# Patient Record
Sex: Male | Born: 1960 | Race: Black or African American | Hispanic: No | Marital: Married | Smoking: Light tobacco smoker
Health system: Southern US, Community
[De-identification: ages and names within clinical notes are randomized; demographics above are authoritative.]

## PROBLEM LIST (undated history)

## (undated) DIAGNOSIS — I10 Essential (primary) hypertension: Secondary | ICD-10-CM

## (undated) HISTORY — PX: KNEE SURGERY: SHX244

---

## 2002-07-17 ENCOUNTER — Ambulatory Visit (HOSPITAL_COMMUNITY): Admission: RE | Admit: 2002-07-17 | Discharge: 2002-07-17 | Payer: Self-pay | Admitting: *Deleted

## 2002-09-11 ENCOUNTER — Emergency Department (HOSPITAL_COMMUNITY): Admission: EM | Admit: 2002-09-11 | Discharge: 2002-09-11 | Payer: Self-pay | Admitting: Emergency Medicine

## 2005-07-18 ENCOUNTER — Emergency Department (HOSPITAL_COMMUNITY): Admission: EM | Admit: 2005-07-18 | Discharge: 2005-07-18 | Payer: Self-pay | Admitting: Family Medicine

## 2005-07-23 ENCOUNTER — Emergency Department (HOSPITAL_COMMUNITY): Admission: EM | Admit: 2005-07-23 | Discharge: 2005-07-23 | Payer: Self-pay | Admitting: Family Medicine

## 2005-08-01 ENCOUNTER — Encounter: Admission: RE | Admit: 2005-08-01 | Discharge: 2005-10-30 | Payer: Self-pay | Admitting: Occupational Medicine

## 2007-07-31 ENCOUNTER — Emergency Department (HOSPITAL_COMMUNITY): Admission: EM | Admit: 2007-07-31 | Discharge: 2007-07-31 | Payer: Self-pay | Admitting: Family Medicine

## 2008-04-06 ENCOUNTER — Emergency Department (HOSPITAL_COMMUNITY): Admission: EM | Admit: 2008-04-06 | Discharge: 2008-04-07 | Payer: Self-pay | Admitting: Emergency Medicine

## 2008-04-07 ENCOUNTER — Ambulatory Visit: Payer: Self-pay | Admitting: Sports Medicine

## 2008-04-07 DIAGNOSIS — M66259 Spontaneous rupture of extensor tendons, unspecified thigh: Secondary | ICD-10-CM

## 2008-04-09 ENCOUNTER — Ambulatory Visit (HOSPITAL_COMMUNITY): Admission: RE | Admit: 2008-04-09 | Discharge: 2008-04-09 | Payer: Self-pay | Admitting: Orthopedic Surgery

## 2010-11-01 NOTE — Op Note (Signed)
Nathan Padilla, Nathan Padilla             ACCOUNT NO.:  [id number]   MEDICAL RECORD NO.:  [id number]          PATIENT TYPE:  AMB   LOCATION:  SDS                          FACILITY:  [facility]   PHYSICIAN:  Nadara Mustard, MD     DATE OF BIRTH:  [birth year]   DATE OF PROCEDURE:  04/09/2008  DATE OF DISCHARGE:                               OPERATIVE REPORT   PREOPERATIVE DIAGNOSIS:  Complete left quadriceps tendon rupture.   POSTOPERATIVE DIAGNOSIS:  Complete left quadriceps tendon rupture.   PROCEDURE:  Reconstruction of left quadriceps tendon.   SURGEON:  Nadara Mustard, MD   ANESTHESIA:  General plus femoral block.   ESTIMATED BLOOD LOSS:  Minimal.   ANTIBIOTICS:  1 gram of Kefzol.   DRAINS:  None.   COMPLICATIONS:  None.   TOURNIQUET TIME:  None.   DISPOSITION:  To PACU in stable condition.   INDICATIONS FOR PROCEDURE:  The patient is a 50 year old gentleman who  was playing baseball when he quickly tried to change direction and  sustained a complete quad tendon rupture.  On examination, he had  complete inability to do a straight leg raise, complete disruption of  the quad tendon.  An ultrasound confirmed the quad tendon rupture.  The  patient is brought at this time for reconstruction of the quad tendon.  Risks and benefits were discussed including infection, neurovascular  injury, rerupture, and need for additional surgery.  The patient states  he understands and wishes to proceed at this time.   DESCRIPTION OF PROCEDURE:  The patient was brought to OR room #17 after  undergoing a popliteal block and he then underwent a general anesthetic.  After adequate level of anesthesia obtained, the patient's left lower  extremity was prepped using chlorhexidine and draped into a sterile  field.  A midline dorsal incision was made over the quad tendon and the  patella.  Visualization showed a complete rupture of the quad tendon.  There was a large hematoma, which was evacuated.  The  wound was  irrigated with 1 liter of normal saline.  Using crack-out suture  technique, 2 crack-out sutures were weaved through the proximal aspect  of the quad tendon exiting distally.  Then 3 separate holes were drilled  through the patella.  These sutures were passed through the patella and  tied over the distal pole of the patella with 4 strands crossing the  repair site.  Another #2 Ethilon was then used to weave across the  repair to reinforce the repair.  The patient's knee was placed through a  range of motion and there was stable fixation.  Again, the wound was  irrigated with normal saline.  Subcu was closed using Vicryl.  The skin  was closed using approximating staples.  The wound was covered with  Adaptic orthopedic sponges, Webril, and Coban.  The patient was placed  in knee immobilizer, extubated and taken to PACU in stable condition.  He was discharged to home.   He has prescription for Vicodin for pain.  Follow up in the office in 1  week.  He is given  a note to return to work on Tuesday, April 14, 2008.      Nadara Mustard, MD  Electronically Signed     MVD/MEDQ  D:  04/09/2008  T:  04/10/2008  Job:  [id number]

## 2011-03-21 LAB — PROTIME-INR
INR: 0.9
Prothrombin Time: 12.1

## 2011-03-21 LAB — CBC
HCT: 41.4
Hemoglobin: 13.3
MCHC: 32.1
MCV: 72.1 — ABNORMAL LOW
Platelets: 225
RBC: 5.74
RDW: 15.2
WBC: 4.9

## 2011-03-21 LAB — COMPREHENSIVE METABOLIC PANEL
ALT: 18
AST: 33
Albumin: 3.8
Alkaline Phosphatase: 40
BUN: 9
CO2: 28
Calcium: 9.5
Chloride: 105
Creatinine, Ser: 0.88
GFR calc Af Amer: >60
GFR calc non Af Amer: >60
Glucose, Bld: 86
Potassium: 4.5
Sodium: 139
Total Bilirubin: 1.4 — ABNORMAL HIGH
Total Protein: 6.5

## 2011-03-21 LAB — APTT: aPTT: 28

## 2015-06-22 DIAGNOSIS — I10 Essential (primary) hypertension: Secondary | ICD-10-CM | POA: Diagnosis not present

## 2015-06-22 DIAGNOSIS — E785 Hyperlipidemia, unspecified: Secondary | ICD-10-CM | POA: Diagnosis not present

## 2015-06-22 DIAGNOSIS — Z1211 Encounter for screening for malignant neoplasm of colon: Secondary | ICD-10-CM | POA: Diagnosis not present

## 2015-07-15 DIAGNOSIS — J342 Deviated nasal septum: Secondary | ICD-10-CM | POA: Diagnosis not present

## 2015-07-15 DIAGNOSIS — J330 Polyp of nasal cavity: Secondary | ICD-10-CM | POA: Diagnosis not present

## 2015-07-27 MED FILL — ATORVASTATIN 10 MG TABLET: 10 | 90 days supply | Qty: 90 | Fill #2

## 2015-07-27 MED FILL — GAVILYTE-N SOLUTION: 420 | 1 days supply | Qty: 4000 | Fill #0

## 2015-07-27 MED FILL — LOSARTAN-HCTZ 50-12.5 MG TA: 50-12.5 | 90 days supply | Qty: 90 | Fill #2

## 2015-07-28 DIAGNOSIS — D126 Benign neoplasm of colon, unspecified: Secondary | ICD-10-CM | POA: Diagnosis not present

## 2015-07-28 DIAGNOSIS — D123 Benign neoplasm of transverse colon: Secondary | ICD-10-CM | POA: Diagnosis not present

## 2015-07-28 DIAGNOSIS — K621 Rectal polyp: Secondary | ICD-10-CM | POA: Diagnosis not present

## 2015-07-28 DIAGNOSIS — D128 Benign neoplasm of rectum: Secondary | ICD-10-CM | POA: Diagnosis not present

## 2015-07-28 DIAGNOSIS — K5730 Diverticulosis of large intestine without perforation or abscess without bleeding: Secondary | ICD-10-CM | POA: Diagnosis not present

## 2015-07-28 DIAGNOSIS — Z1211 Encounter for screening for malignant neoplasm of colon: Secondary | ICD-10-CM | POA: Diagnosis not present

## 2015-12-17 MED FILL — ATORVASTATIN 10 MG TABLET: 10 | 30 days supply | Qty: 30 | Fill #1

## 2015-12-17 MED FILL — LOSARTAN-HCTZ 50-12.5 MG TA: 50-12.5 | 90 days supply | Qty: 90 | Fill #0

## 2016-01-10 ENCOUNTER — Inpatient Hospital Stay (HOSPITAL_COMMUNITY)
Admission: EM | Admit: 2016-01-10 | Discharge: 2016-01-12 | DRG: 378 | Disposition: A | Discharge status: Home / Self Care | Payer: 59 | Attending: Internal Medicine | Admitting: Internal Medicine

## 2016-01-10 ENCOUNTER — Encounter (HOSPITAL_COMMUNITY): Payer: Self-pay | Admitting: *Deleted

## 2016-01-10 DIAGNOSIS — R000 Tachycardia, unspecified: Secondary | ICD-10-CM

## 2016-01-10 DIAGNOSIS — Z79899 Other long term (current) drug therapy: Secondary | ICD-10-CM | POA: Diagnosis not present

## 2016-01-10 DIAGNOSIS — R0600 Dyspnea, unspecified: Secondary | ICD-10-CM | POA: Diagnosis not present

## 2016-01-10 DIAGNOSIS — E871 Hypo-osmolality and hyponatremia: Secondary | ICD-10-CM | POA: Diagnosis not present

## 2016-01-10 DIAGNOSIS — I959 Hypotension, unspecified: Secondary | ICD-10-CM | POA: Diagnosis not present

## 2016-01-10 DIAGNOSIS — D649 Anemia, unspecified: Secondary | ICD-10-CM | POA: Diagnosis not present

## 2016-01-10 DIAGNOSIS — Z91013 Allergy to seafood: Secondary | ICD-10-CM

## 2016-01-10 DIAGNOSIS — E876 Hypokalemia: Secondary | ICD-10-CM | POA: Diagnosis present

## 2016-01-10 DIAGNOSIS — K648 Other hemorrhoids: Secondary | ICD-10-CM | POA: Diagnosis present

## 2016-01-10 DIAGNOSIS — K635 Polyp of colon: Secondary | ICD-10-CM | POA: Diagnosis present

## 2016-01-10 DIAGNOSIS — R739 Hyperglycemia, unspecified: Secondary | ICD-10-CM | POA: Diagnosis present

## 2016-01-10 DIAGNOSIS — K922 Gastrointestinal hemorrhage, unspecified: Secondary | ICD-10-CM | POA: Diagnosis present

## 2016-01-10 DIAGNOSIS — I10 Essential (primary) hypertension: Secondary | ICD-10-CM | POA: Diagnosis not present

## 2016-01-10 DIAGNOSIS — Z7982 Long term (current) use of aspirin: Secondary | ICD-10-CM | POA: Diagnosis not present

## 2016-01-10 DIAGNOSIS — K269 Duodenal ulcer, unspecified as acute or chronic, without hemorrhage or perforation: Secondary | ICD-10-CM | POA: Diagnosis not present

## 2016-01-10 DIAGNOSIS — K921 Melena: Principal | ICD-10-CM | POA: Diagnosis present

## 2016-01-10 DIAGNOSIS — Z87891 Personal history of nicotine dependence: Secondary | ICD-10-CM

## 2016-01-10 DIAGNOSIS — D62 Acute posthemorrhagic anemia: Secondary | ICD-10-CM | POA: Diagnosis not present

## 2016-01-10 DIAGNOSIS — R0602 Shortness of breath: Secondary | ICD-10-CM | POA: Diagnosis not present

## 2016-01-10 HISTORY — DX: Essential (primary) hypertension: I10

## 2016-01-10 LAB — CBC WITH DIFFERENTIAL/PLATELET
BASOS ABS: 0 10*3/uL (ref 0.0–0.1)
BASOS PCT: 0 %
EOS ABS: 0 10*3/uL (ref 0.0–0.7)
Eosinophils Relative: 0 %
HCT: 16.3 % — ABNORMAL LOW (ref 39.0–52.0)
Hemoglobin: 5 g/dL — CL (ref 13.0–17.0)
Lymphocytes Relative: 22 %
Lymphs Abs: 1.7 10*3/uL (ref 0.7–4.0)
MCH: 22.5 pg — AB (ref 26.0–34.0)
MCHC: 30.7 g/dL (ref 30.0–36.0)
MCV: 73.4 fL — ABNORMAL LOW (ref 78.0–100.0)
MONO ABS: 0.5 10*3/uL (ref 0.1–1.0)
Monocytes Relative: 6 %
NEUTROS PCT: 72 %
Neutro Abs: 5.4 10*3/uL (ref 1.7–7.7)
PLATELETS: 215 10*3/uL (ref 150–400)
RBC: 2.22 MIL/uL — AB (ref 4.22–5.81)
RDW: 17.7 % — ABNORMAL HIGH (ref 11.5–15.5)
WBC: 7.6 10*3/uL (ref 4.0–10.5)

## 2016-01-10 LAB — I-STAT CHEM 8, ED
BUN: 11 mg/dL (ref 6–20)
CREATININE: 1 mg/dL (ref 0.61–1.24)
Calcium, Ion: 1.12 mmol/L — ABNORMAL LOW (ref 1.13–1.30)
Chloride: 95 mmol/L — ABNORMAL LOW (ref 101–111)
Glucose, Bld: 172 mg/dL — ABNORMAL HIGH (ref 65–99)
HEMATOCRIT: 17 % — AB (ref 39.0–52.0)
HEMOGLOBIN: 5.8 g/dL — AB (ref 13.0–17.0)
POTASSIUM: 3.4 mmol/L — AB (ref 3.5–5.1)
Sodium: 134 mmol/L — ABNORMAL LOW (ref 135–145)
TCO2: 24 mmol/L (ref 0–100)

## 2016-01-10 LAB — POC OCCULT BLOOD, ED: Fecal Occult Bld: POSITIVE — AB

## 2016-01-10 LAB — COMPREHENSIVE METABOLIC PANEL
ALBUMIN: 3.4 g/dL — AB (ref 3.5–5.0)
ALT: 19 U/L (ref 17–63)
AST: 22 U/L (ref 15–41)
Alkaline Phosphatase: 26 U/L — ABNORMAL LOW (ref 38–126)
Anion gap: 9 (ref 5–15)
BUN: 10 mg/dL (ref 6–20)
CHLORIDE: 99 mmol/L — AB (ref 101–111)
CO2: 25 mmol/L (ref 22–32)
CREATININE: 1.15 mg/dL (ref 0.61–1.24)
Calcium: 8.5 mg/dL — ABNORMAL LOW (ref 8.9–10.3)
GFR calc non Af Amer: >60 mL/min (ref >60)
Glucose, Bld: 182 mg/dL — ABNORMAL HIGH (ref 65–99)
Potassium: 3.3 mmol/L — ABNORMAL LOW (ref 3.5–5.1)
SODIUM: 133 mmol/L — AB (ref 135–145)
Total Bilirubin: 0.4 mg/dL (ref 0.3–1.2)
Total Protein: 5.9 g/dL — ABNORMAL LOW (ref 6.5–8.1)

## 2016-01-10 NOTE — ED Notes (Signed)
PA-C at bedside 

## 2016-01-10 NOTE — ED Triage Notes (Signed)
Pt was seen at PCP around 1700, had blood drawn, results showed hgb of 5. Pt reports weakness, shortness of breath with exertion, denies CP, dark stools since last Tuesday, wife is a nurse on [employer] [place], cap refill slow, tongue is white and dry . Pt take lose dose asa, no thinners.

## 2016-01-10 NOTE — ED Provider Notes (Signed)
[facility] DEPT Provider Note   CSN: [id number] Arrival date & time: 01/10/16  2020  First Provider Contact:  First MD Initiated Contact with Patient 01/10/16 2228        History   Chief Complaint Chief Complaint  Patient presents with  . GI Bleeding    HPI Nathan Padilla is a 55 y.o. male.  The history is provided by the patient, the spouse and medical records. No language interpreter was used.     Nathan Padilla is a 55 y.o. male  with a hx of HTN presents to the Emergency Department complaining of gradual, persistent, progressively worsening SOB onset Friday night while cutting the grass. SOB has persisted with exertion throughout the weekend.  He reports he was seen by his PCP today with a very low Hgb.  Associated symptoms include dark black coffee ground stool onset 6 days ago.  No vomiting, fever, chills, Abdominal pain, vomiting or diarrhea.  Pt reports no international travel, no tick bites, no camping or hiking in the woods.   Nothing makes it better and nothing makes it worse.    Pt takes ASA daily, but no anticoagulants.  Patient's wife is at bedside. She reports patient had a colonoscopy by Dr. Michail Sermon approximately 7 months ago. There were several polyps removed at that time but no other complications.   Past Medical History:  Diagnosis Date  . Hypertension     Patient Active Problem List   Diagnosis Date Noted  . RUPTURE, QUADRICEPS TENDON 04/07/2008    Past Surgical History:  Procedure Laterality Date  . KNEE SURGERY         Home Medications    Prior to Admission medications   Medication Sig Start Date End Date Taking? Authorizing Provider  aspirin EC 81 MG tablet Take 81 mg by mouth daily.   Yes Historical Provider, MD  atorvastatin (LIPITOR) 10 MG tablet Take 10 mg by mouth daily. 12/17/15  Yes Historical Provider, MD  ibuprofen (ADVIL,MOTRIN) 200 MG tablet Take 400 mg by mouth daily as needed (pain).   Yes Historical Provider, MD    losartan-hydrochlorothiazide (HYZAAR) 50-12.5 MG tablet Take 1 tablet by mouth daily. 12/17/15  Yes Historical Provider, MD    Family History History reviewed. No pertinent family history.  Social History Social History  Substance Use Topics  . Smoking status: Light Tobacco Smoker    Types: Cigars  . Smokeless tobacco: Never Used  . Alcohol use Yes     Allergies   Shellfish allergy   Review of Systems Review of Systems  Constitutional: Positive for fatigue.  Respiratory: Positive for shortness of breath.   Cardiovascular: Negative for chest pain, palpitations and leg swelling.  Gastrointestinal: Positive for blood in stool ( black and tarry). Negative for abdominal pain, diarrhea, nausea and vomiting.  Skin: Negative for rash.  Neurological: Positive for weakness ( generalized).  All other systems reviewed and are negative.    Physical Exam Updated Vital Signs BP 113/76 (BP Location: Right Arm)   Pulse 119   Temp 98.3 F (36.8 C) (Oral)   Resp 16   Ht 6\' 3"  (1.905 m)   Wt 99.8 kg   SpO2 100%   BMI 27.50 kg/m   Physical Exam  Constitutional: He appears well-developed and well-nourished. No distress.  Awake, alert, nontoxic appearance  HENT:  Head: Normocephalic and atraumatic.  Mouth/Throat: Oropharynx is clear and moist. No oropharyngeal exudate.  Mucous membranes are pale  Eyes: Conjunctivae are normal. No scleral  icterus.  Neck: Normal range of motion. Neck supple.  Cardiovascular: Regular rhythm and intact distal pulses.  Tachycardia present.   No murmur heard. Pulmonary/Chest: Effort normal and breath sounds normal. No respiratory distress. He has no wheezes.  Equal chest expansion  Abdominal: Soft. Bowel sounds are normal. He exhibits no mass. There is tenderness ( Generalize, described as soreness). There is no rebound and no guarding.  Musculoskeletal: Normal range of motion. He exhibits no edema.  Neurological: He is alert.  Speech is clear and  goal oriented Moves extremities without ataxia  Skin: Skin is warm and dry. He is not diaphoretic. There is pallor.  Psychiatric: He has a normal mood and affect.  Nursing note and vitals reviewed.    ED Treatments / Results  Labs (all labs ordered are listed, but only abnormal results are displayed) Labs Reviewed  CBC WITH DIFFERENTIAL/PLATELET - Abnormal; Notable for the following:       Result Value   RBC 2.22 (*)    Hemoglobin 5.0 (*)    HCT 16.3 (*)    MCV 73.4 (*)    MCH 22.5 (*)    RDW 17.7 (*)    All other components within normal limits  COMPREHENSIVE METABOLIC PANEL - Abnormal; Notable for the following:    Sodium 133 (*)    Potassium 3.3 (*)    Chloride 99 (*)    Glucose, Bld 182 (*)    Calcium 8.5 (*)    Total Protein 5.9 (*)    Albumin 3.4 (*)    Alkaline Phosphatase 26 (*)    All other components within normal limits  I-STAT CHEM 8, ED - Abnormal; Notable for the following:    Sodium 134 (*)    Potassium 3.4 (*)    Chloride 95 (*)    Glucose, Bld 172 (*)    Calcium, Ion 1.12 (*)    Hemoglobin 5.8 (*)    HCT 17.0 (*)    All other components within normal limits  POC OCCULT BLOOD, ED - Abnormal; Notable for the following:    Fecal Occult Bld POSITIVE (*)    All other components within normal limits  PROTIME-INR  APTT  TYPE AND SCREEN  ABO/RH  PREPARE RBC (CROSSMATCH)    EKG  EKG Interpretation None       Radiology No results found.  Procedures Procedures (including critical care time)  Medications Ordered in ED Medications  0.9 %  sodium chloride infusion (not administered)     Initial Impression / Assessment and Plan / ED Course  I have reviewed the triage vital signs and the nursing notes.  Pertinent labs & imaging results that were available during my care of the patient were reviewed by me and considered in my medical decision making (see chart for details).  Clinical Course  Value Comment By Time  Fecal Occult Blood, POC:  (!) POSITIVE With black tarry stool Dierdre Forth, PA-C 07/25 0021  Potassium: (!) 3.4 Mild  Latavion Halls, PA-C 07/25 0021  Hemoglobin: (!!) 5.0 With symptomatic anemia Dierdre Forth, PA-C 07/25 0021  Platelets: 215 normal Dierdre Forth, PA-C 07/25 0022    Nathan Padilla presents with shortness of breath, tachycardia and black tarry stools. Hemoglobin of 5. Patient with symptomatic anemia. Discussed risk versus benefit of blood transfusion and patient wishes to proceed. He will need admission.  Black tarry stools consistent with upper GI bleed. Fecal occult is positive.    12:43 AM  Discussed with Dr. Julian Reil who  will admit to inpatient stepdown.    Final Clinical Impressions(s) / ED Diagnoses   Final diagnoses:  Symptomatic anemia  Hypokalemia  Upper GI bleed  SOB (shortness of breath)  Tachycardia    New Prescriptions New Prescriptions   No medications on file     Abigail Butts, PA-C 01/11/16 0043    Ripley Fraise, MD 01/11/16 2324

## 2016-01-11 ENCOUNTER — Encounter (HOSPITAL_COMMUNITY)
Admission: EM | Disposition: A | Discharge status: Home / Self Care | Payer: Self-pay | Source: Home / Self Care | Attending: Internal Medicine

## 2016-01-11 ENCOUNTER — Inpatient Hospital Stay (HOSPITAL_COMMUNITY): Payer: 59

## 2016-01-11 ENCOUNTER — Encounter (HOSPITAL_COMMUNITY): Payer: Self-pay | Admitting: *Deleted

## 2016-01-11 DIAGNOSIS — I10 Essential (primary) hypertension: Secondary | ICD-10-CM | POA: Diagnosis present

## 2016-01-11 DIAGNOSIS — Z7982 Long term (current) use of aspirin: Secondary | ICD-10-CM | POA: Diagnosis not present

## 2016-01-11 DIAGNOSIS — Z79899 Other long term (current) drug therapy: Secondary | ICD-10-CM | POA: Diagnosis not present

## 2016-01-11 DIAGNOSIS — K922 Gastrointestinal hemorrhage, unspecified: Secondary | ICD-10-CM | POA: Diagnosis present

## 2016-01-11 DIAGNOSIS — R739 Hyperglycemia, unspecified: Secondary | ICD-10-CM | POA: Diagnosis present

## 2016-01-11 DIAGNOSIS — Z87891 Personal history of nicotine dependence: Secondary | ICD-10-CM | POA: Diagnosis not present

## 2016-01-11 DIAGNOSIS — Z91013 Allergy to seafood: Secondary | ICD-10-CM | POA: Diagnosis not present

## 2016-01-11 DIAGNOSIS — K635 Polyp of colon: Secondary | ICD-10-CM | POA: Diagnosis present

## 2016-01-11 DIAGNOSIS — D649 Anemia, unspecified: Secondary | ICD-10-CM | POA: Diagnosis not present

## 2016-01-11 DIAGNOSIS — D62 Acute posthemorrhagic anemia: Secondary | ICD-10-CM | POA: Diagnosis present

## 2016-01-11 DIAGNOSIS — E876 Hypokalemia: Secondary | ICD-10-CM | POA: Diagnosis not present

## 2016-01-11 DIAGNOSIS — R0602 Shortness of breath: Secondary | ICD-10-CM | POA: Diagnosis not present

## 2016-01-11 DIAGNOSIS — K921 Melena: Secondary | ICD-10-CM | POA: Diagnosis not present

## 2016-01-11 DIAGNOSIS — K648 Other hemorrhoids: Secondary | ICD-10-CM | POA: Diagnosis present

## 2016-01-11 DIAGNOSIS — E871 Hypo-osmolality and hyponatremia: Secondary | ICD-10-CM | POA: Diagnosis present

## 2016-01-11 DIAGNOSIS — K269 Duodenal ulcer, unspecified as acute or chronic, without hemorrhage or perforation: Secondary | ICD-10-CM | POA: Diagnosis present

## 2016-01-11 DIAGNOSIS — K264 Chronic or unspecified duodenal ulcer with hemorrhage: Secondary | ICD-10-CM | POA: Diagnosis not present

## 2016-01-11 HISTORY — PX: ESOPHAGOGASTRODUODENOSCOPY: SHX5428

## 2016-01-11 LAB — MRSA PCR SCREENING: MRSA BY PCR: NEGATIVE

## 2016-01-11 LAB — CBC
HCT: 26.8 % — ABNORMAL LOW (ref 39.0–52.0)
HEMOGLOBIN: 8.3 g/dL — AB (ref 13.0–17.0)
MCH: 24.6 pg — AB (ref 26.0–34.0)
MCHC: 31 g/dL (ref 30.0–36.0)
MCV: 79.5 fL (ref 78.0–100.0)
Platelets: 182 10*3/uL (ref 150–400)
RBC: 3.37 MIL/uL — ABNORMAL LOW (ref 4.22–5.81)
RDW: 20.3 % — AB (ref 11.5–15.5)
WBC: 6.5 10*3/uL (ref 4.0–10.5)

## 2016-01-11 LAB — ABO/RH: ABO/RH(D): B POS

## 2016-01-11 LAB — PROTIME-INR
INR: 1.01 (ref 0.00–1.49)
Prothrombin Time: 13.5 seconds (ref 11.6–15.2)

## 2016-01-11 LAB — PREPARE RBC (CROSSMATCH)

## 2016-01-11 LAB — APTT: aPTT: 26 seconds (ref 24–37)

## 2016-01-11 SURGERY — EGD (ESOPHAGOGASTRODUODENOSCOPY)
Anesthesia: Moderate Sedation

## 2016-01-11 MED ORDER — POTASSIUM CHLORIDE CRYS ER 20 MEQ PO TBCR
40.0000 meq | EXTENDED_RELEASE_TABLET | Freq: Once | ORAL | Status: AC
  Administered 2016-01-11: 40 meq via ORAL

## 2016-01-11 MED ORDER — SODIUM CHLORIDE 0.9 % IV SOLN: INTRAVENOUS | Status: DC

## 2016-01-11 MED ORDER — SODIUM CHLORIDE 0.9% FLUSH
3.0000 mL | Freq: Two times a day (BID) | INTRAVENOUS | Status: DC
  Administered 2016-01-11 – 2016-01-12 (×3): 3 mL via INTRAVENOUS

## 2016-01-11 MED ORDER — MIDAZOLAM HCL 10 MG/2ML IJ SOLN
INTRAMUSCULAR | Status: DC | PRN
  Administered 2016-01-11 (×2): 1 mg via INTRAVENOUS
  Administered 2016-01-11 (×2): 2 mg via INTRAVENOUS

## 2016-01-11 MED ORDER — SODIUM CHLORIDE 0.9 % IV SOLN: Freq: Once | INTRAVENOUS | Status: DC

## 2016-01-11 MED ORDER — DIPHENHYDRAMINE HCL 50 MG/ML IJ SOLN
INTRAMUSCULAR | Status: DC | PRN
  Administered 2016-01-11: 25 mg via INTRAVENOUS

## 2016-01-11 MED ORDER — FENTANYL CITRATE (PF) 100 MCG/2ML IJ SOLN
INTRAMUSCULAR | Status: AC
  Filled 2016-01-11: qty 2

## 2016-01-11 MED ORDER — ATORVASTATIN CALCIUM 10 MG PO TABS
10.0000 mg | ORAL_TABLET | Freq: Every day | ORAL | Status: DC
  Administered 2016-01-11 – 2016-01-12 (×2): 10 mg via ORAL
  Filled 2016-01-11 (×2): qty 1

## 2016-01-11 MED ORDER — FENTANYL CITRATE (PF) 100 MCG/2ML IJ SOLN
INTRAMUSCULAR | Status: DC | PRN
  Administered 2016-01-11 (×3): 25 ug via INTRAVENOUS

## 2016-01-11 MED ORDER — MIDAZOLAM HCL 5 MG/ML IJ SOLN
INTRAMUSCULAR | Status: AC
  Filled 2016-01-11: qty 2

## 2016-01-11 MED ORDER — DIPHENHYDRAMINE HCL 50 MG/ML IJ SOLN
INTRAMUSCULAR | Status: AC
  Filled 2016-01-11: qty 1

## 2016-01-11 MED ORDER — PANTOPRAZOLE SODIUM 40 MG PO TBEC
80.0000 mg | DELAYED_RELEASE_TABLET | Freq: Two times a day (BID) | ORAL | Status: DC
  Administered 2016-01-11 – 2016-01-12 (×4): 80 mg via ORAL
  Filled 2016-01-11 (×4): qty 2

## 2016-01-11 MED ORDER — KCL IN DEXTROSE-NACL 20-5-0.9 MEQ/L-%-% IV SOLN
INTRAVENOUS | Status: DC
  Administered 2016-01-11: 12:00:00 via INTRAVENOUS
  Filled 2016-01-11 (×2): qty 1000

## 2016-01-11 MED ORDER — POTASSIUM CHLORIDE 10 MEQ/100ML IV SOLN
10.0000 meq | INTRAVENOUS | Status: DC
  Administered 2016-01-11: 10 meq via INTRAVENOUS
  Filled 2016-01-11 (×3): qty 100

## 2016-01-11 MED ORDER — BUTAMBEN-TETRACAINE-BENZOCAINE 2-2-14 % EX AERO
INHALATION_SPRAY | CUTANEOUS | Status: DC | PRN
  Administered 2016-01-11: 2 via TOPICAL

## 2016-01-11 NOTE — Progress Notes (Signed)
[facility] TEAM 1 - Stepdown/ICU TEAM  YORK HARCUM  [id number] DOB: 03/30/1961 DOA: 01/10/2016 PCP: Kandice Hams, MD    Brief Narrative:  55 y.o. male with history of HTN who presented to the ED with c/o SOB.  Symptoms onset was ~4 days prior when he was cutting the grass.  Symptoms gradually worsening since onset so he saw his PCP who found he had a HGB of 5 and sent him in to ED.  Patient reported black tarry stool which began approx 6 days prior to his presentation;  initially liquid but then become formed over the course of the week.  Subjective: Pt is seen for a f/u visit.    Assessment & Plan:  Melena - ?UGIB  Severe acute blood loss anemia  Hyponatremia  Hypokalemia  Hyperglycemia without history of diabetes  HTN  DVT prophylaxis: SCDs Code Status: FULL CODE Family Communication: no family present at time of exam  Disposition Plan:   Consultants:  [facility]  Procedures: none  Antimicrobials:  none   Objective: Blood pressure 126/82, pulse 93, temperature 99 F (37.2 C), temperature source Oral, resp. rate 14, height 6\' 4"  (1.93 m), weight 93.2 kg (205 lb 7.5 oz), SpO2 99 %.  Intake/Output Summary (Last 24 hours) at 01/11/16 0922 Last data filed at 01/11/16 0838  Gross per 24 hour  Intake             2327 ml  Output                0 ml  Net             2327 ml   Filed Weights   01/10/16 2031 01/11/16 0210  Weight: 99.8 kg (220 lb) 93.2 kg (205 lb 7.5 oz)    Examination: Pt was seen for a f/u visit.    CBC:  Recent Labs Lab 01/10/16 2100 01/10/16 2101  WBC  --  7.6  NEUTROABS  --  5.4  HGB 5.8* 5.0*  HCT 17.0* 16.3*  MCV  --  73.4*  PLT  --  123456   Basic Metabolic Panel:  Recent Labs Lab 01/10/16 2100 01/10/16 2101  NA 134* 133*  K 3.4* 3.3*  CL 95* 99*  CO2  --  25  GLUCOSE 172* 182*  BUN 11 10  CREATININE 1.00 1.15  CALCIUM  --  8.5*   GFR: Estimated Creatinine Clearance: 92.3 mL/min (by C-G formula based on  SCr of 1.15 mg/dL).  Liver Function Tests:  Recent Labs Lab 01/10/16 2101  AST 22  ALT 19  ALKPHOS 26*  BILITOT 0.4  PROT 5.9*  ALBUMIN 3.4*   Coagulation Profile:  Recent Labs Lab 01/10/16 0026  INR 1.01    Recent Results (from the past 240 hour(s))  MRSA PCR Screening     Status: None   Collection Time: 01/11/16  3:58 AM  Result Value Ref Range Status   MRSA by PCR NEGATIVE NEGATIVE Final    Comment:        The GeneXpert MRSA Assay (FDA approved for NASAL specimens only), is one component of a comprehensive MRSA colonization surveillance program. It is not intended to diagnose MRSA infection nor to guide or monitor treatment for MRSA infections.      Scheduled Meds: . sodium chloride   Intravenous Once  . atorvastatin  10 mg Oral Daily  . pantoprazole  80 mg Oral BID  . sodium chloride flush  3 mL Intravenous Q12H  LOS: 0 days   No Charge  Cherene Altes, MD [facility] Office  [phone] Pager - Text Page per Amion as per below:  On-Call/Text Page:      [email or web address]      password [email or web address]  If 7PM-7AM, please contact night-coverage [email or web address] Password [email or web address] 01/11/2016, 9:22 AM

## 2016-01-11 NOTE — ED Notes (Signed)
Patient denies any SOB or pruritus, states he feels fine. Tolerating blood transfusion well. Patient's wife at bedside.

## 2016-01-11 NOTE — H&P (Signed)
History and Physical    Nathan Padilla [id number] DOB: 04-18-1961 DOA: 01/10/2016   PCP: Kandice Hams, MD Chief Complaint:  Chief Complaint  Patient presents with  . GI Bleeding    HPI: Nathan Padilla is a 55 y.o. male with medical history significant of HTN.  Presents to the ED with c/o SOB.  Symptoms onset Friday night while cutting the grass.  Symptoms gradually worsening since onset and persistent.  Saw PCP today who found he had a very low HGB of 5 and sent him in to ED.  Patient reports black tarry stool onset Tuesday of last week, was initially liquid but has become formed over the course of the week.  ED Course: HGB 5.0, 3 unit PRBC ordered for transfusion, hemoccult positive.  Review of Systems: As per HPI otherwise 10 point review of systems negative.    Past Medical History:  Diagnosis Date  . Hypertension     Past Surgical History:  Procedure Laterality Date  . KNEE SURGERY       reports that he has been smoking Cigars.  He has never used smokeless tobacco. He reports that he drinks alcohol. He reports that he uses drugs, including Marijuana.  Allergies  Allergen Reactions  . Shellfish Allergy Anaphylaxis    History reviewed. No pertinent family history.  No family history related to his GI bleed today.   Prior to Admission medications   Medication Sig Start Date End Date Taking? Authorizing Provider  aspirin EC 81 MG tablet Take 81 mg by mouth daily.   Yes Historical Provider, MD  atorvastatin (LIPITOR) 10 MG tablet Take 10 mg by mouth daily. 12/17/15  Yes Historical Provider, MD  ibuprofen (ADVIL,MOTRIN) 200 MG tablet Take 400 mg by mouth daily as needed (pain).   Yes Historical Provider, MD  losartan-hydrochlorothiazide (HYZAAR) 50-12.5 MG tablet Take 1 tablet by mouth daily. 12/17/15  Yes Historical Provider, MD    Physical Exam: Vitals:   01/11/16 0030 01/11/16 0040 01/11/16 0045 01/11/16 0106  BP: 127/79 127/79 125/74 128/69  Pulse:  102 101 104 102  Resp: 14 15 12 12   Temp:  98.6 F (37 C)  98.7 F (37.1 C)  TempSrc:  Oral  Oral  SpO2: 100% 100% 100% 100%  Weight:      Height:          Constitutional: NAD, calm, comfortable Eyes: PERRL, lids and conjunctivae normal ENMT: Mucous membranes are moist. Posterior pharynx clear of any exudate or lesions.Normal dentition.  Neck: normal, supple, no masses, no thyromegaly Respiratory: clear to auscultation bilaterally, no wheezing, no crackles. Normal respiratory effort. No accessory muscle use.  Cardiovascular: Regular rate and rhythm, no murmurs / rubs / gallops. No extremity edema. 2+ pedal pulses. No carotid bruits.  Abdomen: no tenderness, no masses palpated. No hepatosplenomegaly. Bowel sounds positive.  Musculoskeletal: no clubbing / cyanosis. No joint deformity upper and lower extremities. Good ROM, no contractures. Normal muscle tone.  Skin: no rashes, lesions, ulcers. No induration Neurologic: CN 2-12 grossly intact. Sensation intact, DTR normal. Strength 5/5 in all 4.  Psychiatric: Normal judgment and insight. Alert and oriented x 3. Normal mood.    Labs on Admission: I have personally reviewed following labs and imaging studies  CBC:  Recent Labs Lab 01/10/16 2100 01/10/16 2101  WBC  --  7.6  NEUTROABS  --  5.4  HGB 5.8* 5.0*  HCT 17.0* 16.3*  MCV  --  73.4*  PLT  --  215  Basic Metabolic Panel:  Recent Labs Lab 01/10/16 2100 01/10/16 2101  NA 134* 133*  K 3.4* 3.3*  CL 95* 99*  CO2  --  25  GLUCOSE 172* 182*  BUN 11 10  CREATININE 1.00 1.15  CALCIUM  --  8.5*   GFR: Estimated Creatinine Clearance: 89.8 mL/min (by C-G formula based on SCr of 1.15 mg/dL). Liver Function Tests:  Recent Labs Lab 01/10/16 2101  AST 22  ALT 19  ALKPHOS 26*  BILITOT 0.4  PROT 5.9*  ALBUMIN 3.4*   No results for input(s): LIPASE, AMYLASE in the last 168 hours. No results for input(s): AMMONIA in the last 168 hours. Coagulation  Profile:  Recent Labs Lab 01/10/16 0026  INR 1.01   Cardiac Enzymes: No results for input(s): CKTOTAL, CKMB, CKMBINDEX, TROPONINI in the last 168 hours. BNP (last 3 results) No results for input(s): PROBNP in the last 8760 hours. HbA1C: No results for input(s): HGBA1C in the last 72 hours. CBG: No results for input(s): GLUCAP in the last 168 hours. Lipid Profile: No results for input(s): CHOL, HDL, LDLCALC, TRIG, CHOLHDL, LDLDIRECT in the last 72 hours. Thyroid Function Tests: No results for input(s): TSH, T4TOTAL, FREET4, T3FREE, THYROIDAB in the last 72 hours. Anemia Panel: No results for input(s): VITAMINB12, FOLATE, FERRITIN, TIBC, IRON, RETICCTPCT in the last 72 hours. Urine analysis: No results found for: COLORURINE, APPEARANCEUR, LABSPEC, PHURINE, GLUCOSEU, HGBUR, BILIRUBINUR, KETONESUR, PROTEINUR, UROBILINOGEN, NITRITE, LEUKOCYTESUR Sepsis Labs: @LABRCNTIP (procalcitonin:4,lacticidven:4) )No results found for this or any previous visit (from the past 240 hour(s)).   Radiological Exams on Admission: No results found.  EKG: Independently reviewed.  Assessment/Plan Principal Problem:   UGIB (upper gastrointestinal bleed) Active Problems:   Symptomatic anemia   UGIB -  Sounds like bleeding rate has slowed / stopped based on history.  Call GI in AM, likely needs upper EGD  NPO  Holding ibuprofen and ASA  Protonix  Symptomatic anemia -  Transfusing 3 units PRBC  Repeat H/H post transfusion  Tele monitor   DVT prophylaxis: SCDs Code Status: Full Family Communication: Wife at bedside (wife is a Therapist, nutritional) Consults called: None Admission status: Admit to inpatient   Etta Quill DO [facility] Pager [phone] from 7PM-7AM  If 7AM-7PM, please contact the day physician for the patient [email or web address] Password [email or web address]  01/11/2016, 1:26 AM

## 2016-01-11 NOTE — ED Notes (Signed)
Handoff report given to [place], [place]

## 2016-01-11 NOTE — Consult Note (Signed)
Referring Provider: Dr. Thereasa Solo Primary Care Physician:  Kandice Hams, MD Primary Gastroenterologist:  Dr. Michail Sermon  Reason for Consultation:  Melena  HPI: Nathan Padilla is a 55 y.o. male with onset of black tarry stools that started a week ago and have been occurring daily. Felt dizzy and short of breath last Friday. Denies N/V/abdominal pain/hematemesis/hematochezia. He takes a baby aspirin but denies other NSAIDs. Colonoscopy in 2/17 showed colon polyps and internal hemorrhoids. No known history of ulcers. Hgb 5 on admit and s/p 3 U PRBCs to 8.3 now.   Past Medical History:  Diagnosis Date  . Hypertension     Past Surgical History:  Procedure Laterality Date  . KNEE SURGERY      Prior to Admission medications   Medication Sig Start Date End Date Taking? Authorizing Provider  aspirin EC 81 MG tablet Take 81 mg by mouth daily.   Yes Historical Provider, MD  atorvastatin (LIPITOR) 10 MG tablet Take 10 mg by mouth daily. 12/17/15  Yes Historical Provider, MD  ibuprofen (ADVIL,MOTRIN) 200 MG tablet Take 400 mg by mouth daily as needed (pain).   Yes Historical Provider, MD  losartan-hydrochlorothiazide (HYZAAR) 50-12.5 MG tablet Take 1 tablet by mouth daily. 12/17/15  Yes Historical Provider, MD    Scheduled Meds: . atorvastatin  10 mg Oral Daily  . pantoprazole  80 mg Oral BID  . sodium chloride flush  3 mL Intravenous Q12H   Continuous Infusions: . dextrose 5 % and 0.9 % NaCl with KCl 20 mEq/L 75 mL/hr at 01/11/16 1201   PRN Meds:.  Allergies as of 01/10/2016 - Review Complete 01/10/2016  Allergen Reaction Noted  . Shellfish allergy Anaphylaxis 01/10/2016    History reviewed. No pertinent family history.  Social History   Social History  . Marital status: Married    Spouse name: N/A  . Number of children: N/A  . Years of education: N/A   Occupational History  . Not on file.   Social History Main Topics  . Smoking status: Light Tobacco Smoker    Types:  Cigars  . Smokeless tobacco: Never Used  . Alcohol use Yes  . Drug use:     Types: Marijuana  . Sexual activity: Not on file   Other Topics Concern  . Not on file   Social History Narrative  . No narrative on file    Review of Systems: All negative except as stated above in HPI.  Physical Exam: Vital signs: Vitals:   01/11/16 0838 01/11/16 1103  BP: 126/82 108/75  Pulse: 93 88  Resp: 14 17  Temp: 99 F (37.2 C) 98.2 F (36.8 C)   Last BM Date: 01/10/16 General:   Alert,  Well-developed, well-nourished, pleasant and cooperative in NAD HEENT: anicteric sclera Neck: supple, nontender Lungs:  Clear throughout to auscultation.   No wheezes, crackles, or rhonchi. No acute distress. Heart:  Regular rate and rhythm; no murmurs, clicks, rubs,  or gallops. Abdomen: soft, nontender, nondistended, +BS  Rectal:  Deferred Ext: no edema  GI:  Lab Results:  Recent Labs  01/10/16 2100 01/10/16 2101 01/11/16 0945  WBC  --  7.6 6.5  HGB 5.8* 5.0* 8.3*  HCT 17.0* 16.3* 26.8*  PLT  --  215 182   BMET  Recent Labs  01/10/16 2100 01/10/16 2101  NA 134* 133*  K 3.4* 3.3*  CL 95* 99*  CO2  --  25  GLUCOSE 172* 182*  BUN 11 10  CREATININE 1.00 1.15  CALCIUM  --  8.5*   LFT  Recent Labs  01/10/16 2101  PROT 5.9*  ALBUMIN 3.4*  AST 22  ALT 19  ALKPHOS 26*  BILITOT 0.4   PT/INR  Recent Labs  01/10/16 0026  LABPROT 13.5  INR 1.01     Studies/Results: Dg Chest Port 1 View  Result Date: 01/11/2016 CLINICAL DATA:  Shortness of breath, tachycardia, upper gastrointestinal hemorrhage EXAM: PORTABLE CHEST 1 VIEW COMPARISON:  None in PACs FINDINGS: The lungs are well-expanded and clear. The heart and pulmonary vascularity are normal. The mediastinum is normal in width. There is no pleural effusion. The bony thorax is unremarkable. IMPRESSION: There is no active cardiopulmonary disease. Electronically Signed   By: David  Martinique M.D.   On: 01/11/2016  07:45   Impression/Plan: 55 yo with melenic stools concerning for a peptic ulcer bleed. Hemodynamically stable and dizziness has resolved with blood transfusions. NPO. EGD today. Continue PPI PO BID unless actively bleeding ulcer seen and then will need Protonix infusion.    LOS: 0 days   [place] C.  01/11/2016, 1:25 PM  Pager [phone]  If no answer or after 5 PM call [phone]

## 2016-01-11 NOTE — Op Note (Signed)
[facility] Patient Name: Nathan Padilla Procedure Date : 01/11/2016 MRN: [id number] Attending MD: Lear Ng , MD Date of Birth: 1961-03-09 CSN: [id number] Age: 55 Admit Type: Inpatient Procedure:                Upper GI endoscopy Indications:              Acute post hemorrhagic anemia, Melena Providers:                Lear Ng, MD, Zenon Mayo, RN, Elspeth Cho Tech., Technician Referring MD:              Medicines:                Fentanyl 75 micrograms IV, Midazolam 6 mg IV,                            Diphenhydramine 25 mg IV Complications:            No immediate complications. Estimated Blood Loss:     Estimated blood loss was minimal. Procedure:                Pre-Anesthesia Assessment:                           - Prior to the procedure, a History and Physical                            was performed, and patient medications and                            allergies were reviewed. The patient's tolerance of                            previous anesthesia was also reviewed. The risks                            and benefits of the procedure and the sedation                            options and risks were discussed with the patient.                            All questions were answered, and informed consent                            was obtained. Prior Anticoagulants: The patient has                            taken no previous anticoagulant or antiplatelet                            agents. ASA Grade Assessment: II - A patient with  mild systemic disease. After reviewing the risks                            and benefits, the patient was deemed in                            satisfactory condition to undergo the procedure.                           After obtaining informed consent, the endoscope was                            passed under direct vision. Throughout the      procedure, the patient's blood pressure, pulse, and                            oxygen saturations were monitored continuously. The                            [id number]) scope was introduced through the                            mouth, and advanced to the second part of duodenum.                            The upper GI endoscopy was accomplished without                            difficulty. The patient tolerated the procedure                            fairly well. Scope In: Scope Out: Findings:      The examined esophagus was normal.      The Z-line was found 45 cm from the incisors.      The entire examined stomach was normal.      The cardia and gastric fundus were normal on retroflexion.      Few oozing linear and superficial duodenal ulcers with no stigmata of       bleeding were found in the duodenal bulb. The largest lesion was 1 mm in       largest dimension.      Segmental mucosal changes characterized by congestion and erythema were       found in the duodenal bulb.      The second portion of the duodenum was normal. Impression:               - Normal esophagus.                           - Z-line, 45 cm from the incisors.                           - Normal stomach.                           - Multiple oozing duodenal ulcers with no  stigmata                            of bleeding.                           - Mucosal changes in the duodenum.                           - Normal second portion of the duodenum.                           - No specimens collected. Moderate Sedation:      Moderate (conscious) sedation was administered by the endoscopy nurse       and supervised by the endoscopist. The following parameters were       monitored: oxygen saturation, heart rate, blood pressure, and response       to care. Recommendation:           - Advance diet as tolerated and soft diet.                           - Perform an H. pylori serology.                            - Use Protonix (pantoprazole) 40 mg PO BID.                           - Post procedure medication orders were given. Procedure Code(s):        --- Professional ---                           [phone], Esophagogastroduodenoscopy, flexible,                            transoral; diagnostic, including collection of                            specimen(s) by brushing or washing, when performed                            (separate procedure) Diagnosis Code(s):        --- Professional ---                           K92.1, Melena (includes Hematochezia)                           K26.4, Chronic or unspecified duodenal ulcer with                            hemorrhage                           D62, Acute posthemorrhagic anemia                           K31.89, Other diseases of stomach and duodenum  CPT copyright 2016 [employer]. All rights reserved. The codes documented in this report are preliminary and upon coder review may  be revised to meet current compliance requirements. Lear Ng, MD 01/11/2016 5:01:53 PM This report has been signed electronically. Number of Addenda: 0

## 2016-01-11 NOTE — Interval H&P Note (Signed)
History and Physical Interval Note:  01/11/2016 4:09 PM  Nathan Padilla  has presented today for surgery, with the diagnosis of melena  The various methods of treatment have been discussed with the patient and family. After consideration of risks, benefits and other options for treatment, the patient has consented to  Procedure(s): ESOPHAGOGASTRODUODENOSCOPY (EGD) (N/A) as a surgical intervention .  The patient's history has been reviewed, patient examined, no change in status, stable for surgery.  I have reviewed the patient's chart and labs.  Questions were answered to the patient's satisfaction.     [place] C.

## 2016-01-11 NOTE — ED Notes (Signed)
Admitting at bedside 

## 2016-01-11 NOTE — H&P (View-Only) (Signed)
Referring Provider: Dr. Thereasa Solo Primary Care Physician:  Kandice Hams, MD Primary Gastroenterologist:  Dr. Michail Sermon  Reason for Consultation:  Melena  HPI: Nathan Padilla is a 55 y.o. male with onset of black tarry stools that started a week ago and have been occurring daily. Felt dizzy and short of breath last Friday. Denies N/V/abdominal pain/hematemesis/hematochezia. He takes a baby aspirin but denies other NSAIDs. Colonoscopy in 2/17 showed colon polyps and internal hemorrhoids. No known history of ulcers. Hgb 5 on admit and s/p 3 U PRBCs to 8.3 now.   Past Medical History:  Diagnosis Date  . Hypertension     Past Surgical History:  Procedure Laterality Date  . KNEE SURGERY      Prior to Admission medications   Medication Sig Start Date End Date Taking? Authorizing Provider  aspirin EC 81 MG tablet Take 81 mg by mouth daily.   Yes Historical Provider, MD  atorvastatin (LIPITOR) 10 MG tablet Take 10 mg by mouth daily. 12/17/15  Yes Historical Provider, MD  ibuprofen (ADVIL,MOTRIN) 200 MG tablet Take 400 mg by mouth daily as needed (pain).   Yes Historical Provider, MD  losartan-hydrochlorothiazide (HYZAAR) 50-12.5 MG tablet Take 1 tablet by mouth daily. 12/17/15  Yes Historical Provider, MD    Scheduled Meds: . atorvastatin  10 mg Oral Daily  . pantoprazole  80 mg Oral BID  . sodium chloride flush  3 mL Intravenous Q12H   Continuous Infusions: . dextrose 5 % and 0.9 % NaCl with KCl 20 mEq/L 75 mL/hr at 01/11/16 1201   PRN Meds:.  Allergies as of 01/10/2016 - Review Complete 01/10/2016  Allergen Reaction Noted  . Shellfish allergy Anaphylaxis 01/10/2016    History reviewed. No pertinent family history.  Social History   Social History  . Marital status: Married    Spouse name: N/A  . Number of children: N/A  . Years of education: N/A   Occupational History  . Not on file.   Social History Main Topics  . Smoking status: Light Tobacco Smoker    Types:  Cigars  . Smokeless tobacco: Never Used  . Alcohol use Yes  . Drug use:     Types: Marijuana  . Sexual activity: Not on file   Other Topics Concern  . Not on file   Social History Narrative  . No narrative on file    Review of Systems: All negative except as stated above in HPI.  Physical Exam: Vital signs: Vitals:   01/11/16 0838 01/11/16 1103  BP: 126/82 108/75  Pulse: 93 88  Resp: 14 17  Temp: 99 F (37.2 C) 98.2 F (36.8 C)   Last BM Date: 01/10/16 General:   Alert,  Well-developed, well-nourished, pleasant and cooperative in NAD HEENT: anicteric sclera Neck: supple, nontender Lungs:  Clear throughout to auscultation.   No wheezes, crackles, or rhonchi. No acute distress. Heart:  Regular rate and rhythm; no murmurs, clicks, rubs,  or gallops. Abdomen: soft, nontender, nondistended, +BS  Rectal:  Deferred Ext: no edema  GI:  Lab Results:  Recent Labs  01/10/16 2100 01/10/16 2101 01/11/16 0945  WBC  --  7.6 6.5  HGB 5.8* 5.0* 8.3*  HCT 17.0* 16.3* 26.8*  PLT  --  215 182   BMET  Recent Labs  01/10/16 2100 01/10/16 2101  NA 134* 133*  K 3.4* 3.3*  CL 95* 99*  CO2  --  25  GLUCOSE 172* 182*  BUN 11 10  CREATININE 1.00 1.15  CALCIUM  --  8.5*   LFT  Recent Labs  01/10/16 2101  PROT 5.9*  ALBUMIN 3.4*  AST 22  ALT 19  ALKPHOS 26*  BILITOT 0.4   PT/INR  Recent Labs  01/10/16 0026  LABPROT 13.5  INR 1.01     Studies/Results: Dg Chest Port 1 View  Result Date: 01/11/2016 CLINICAL DATA:  Shortness of breath, tachycardia, upper gastrointestinal hemorrhage EXAM: PORTABLE CHEST 1 VIEW COMPARISON:  None in PACs FINDINGS: The lungs are well-expanded and clear. The heart and pulmonary vascularity are normal. The mediastinum is normal in width. There is no pleural effusion. The bony thorax is unremarkable. IMPRESSION: There is no active cardiopulmonary disease. Electronically Signed   By: David  Martinique M.D.   On: 01/11/2016  07:45   Impression/Plan: 55 yo with melenic stools concerning for a peptic ulcer bleed. Hemodynamically stable and dizziness has resolved with blood transfusions. NPO. EGD today. Continue PPI PO BID unless actively bleeding ulcer seen and then will need Protonix infusion.    LOS: 0 days   [place] C.  01/11/2016, 1:25 PM  Pager [phone]  If no answer or after 5 PM call [phone]

## 2016-01-12 DIAGNOSIS — D649 Anemia, unspecified: Secondary | ICD-10-CM

## 2016-01-12 DIAGNOSIS — K922 Gastrointestinal hemorrhage, unspecified: Secondary | ICD-10-CM

## 2016-01-12 DIAGNOSIS — E876 Hypokalemia: Secondary | ICD-10-CM

## 2016-01-12 LAB — COMPREHENSIVE METABOLIC PANEL
ALBUMIN: 3.1 g/dL — AB (ref 3.5–5.0)
ALK PHOS: 29 U/L — AB (ref 38–126)
ALT: 19 U/L (ref 17–63)
ANION GAP: 4 — AB (ref 5–15)
AST: 20 U/L (ref 15–41)
BUN: 9 mg/dL (ref 6–20)
CO2: 27 mmol/L (ref 22–32)
Calcium: 8.3 mg/dL — ABNORMAL LOW (ref 8.9–10.3)
Chloride: 105 mmol/L (ref 101–111)
Creatinine, Ser: 1.09 mg/dL (ref 0.61–1.24)
GFR calc Af Amer: >60 mL/min (ref >60)
GFR calc non Af Amer: >60 mL/min (ref >60)
GLUCOSE: 105 mg/dL — AB (ref 65–99)
POTASSIUM: 3.8 mmol/L (ref 3.5–5.1)
SODIUM: 136 mmol/L (ref 135–145)
Total Bilirubin: 0.5 mg/dL (ref 0.3–1.2)
Total Protein: 5.4 g/dL — ABNORMAL LOW (ref 6.5–8.1)

## 2016-01-12 LAB — MAGNESIUM: Magnesium: 2.2 mg/dL (ref 1.7–2.4)

## 2016-01-12 LAB — CBC
HEMATOCRIT: 26.1 % — AB (ref 39.0–52.0)
HEMOGLOBIN: 8.2 g/dL — AB (ref 13.0–17.0)
MCH: 24.9 pg — ABNORMAL LOW (ref 26.0–34.0)
MCHC: 31.4 g/dL (ref 30.0–36.0)
MCV: 79.3 fL (ref 78.0–100.0)
Platelets: 194 10*3/uL (ref 150–400)
RBC: 3.29 MIL/uL — ABNORMAL LOW (ref 4.22–5.81)
RDW: 19.5 % — ABNORMAL HIGH (ref 11.5–15.5)
WBC: 4.4 10*3/uL (ref 4.0–10.5)

## 2016-01-12 MED ORDER — PANTOPRAZOLE SODIUM 40 MG PO TBEC: 80.0000 mg | DELAYED_RELEASE_TABLET | Freq: Two times a day (BID) | ORAL | 1 refills | Status: AC

## 2016-01-12 MED FILL — PANTOPRAZOLE SOD DR 40 MG T: 40 | 45 days supply | Qty: 180 | Fill #0 | Status: TO

## 2016-01-12 NOTE — Care Management Note (Signed)
Case Management Note  Patient Details  Name: Nathan Padilla MRN: [id number] Date of Birth: August 13, 1960  Subjective/Objective:  Patient is from home with spouse, pta indep, patient is for  Possible dc today, no needs.                   Action/Plan:   Expected Discharge Date:  01/14/16               Expected Discharge Plan:  Home/Self Care  In-House Referral:     Discharge planning Services  CM Consult  Post Acute Care Choice:    Choice offered to:     DME Arranged:    DME Agency:     HH Arranged:    HH Agency:     Status of Service:  Completed, signed off  If discussed at [employer] of Stay Meetings, dates discussed:    Additional Comments:  Zenon Mayo, RN 01/12/2016, 10:38 AM

## 2016-01-12 NOTE — Progress Notes (Signed)
Patient ID: Nathan Padilla, male   DOB: 09/07/1960, 55 y.o.   MRN: [id number] [facility] Progress Note  Nathan Padilla 55 y.o. 1960-11-15   Subjective: Denies abdominal pain, N/V. Dark stool overnight. Feels good. Tolerating diet. Wife at bedside  Objective: Vital signs: Vitals:   01/12/16 0724 01/12/16 1158  BP: 104/73 123/83  Pulse: 86 81  Resp:    Temp: 98.2 F (36.8 C) 98 F (36.7 C)    Physical Exam: Gen: alert, no acute distress  CV: RRR Chest: CTA B Abd: soft, nontender, nondistended, +BS  Lab Results:  Recent Labs  01/10/16 2101 01/12/16 0357  NA 133* 136  K 3.3* 3.8  CL 99* 105  CO2 25 27  GLUCOSE 182* 105*  BUN 10 9  CREATININE 1.15 1.09  CALCIUM 8.5* 8.3*  MG  --  2.2    Recent Labs  01/10/16 2101 01/12/16 0357  AST 22 20  ALT 19 19  ALKPHOS 26* 29*  BILITOT 0.4 0.5  PROT 5.9* 5.4*  ALBUMIN 3.4* 3.1*    Recent Labs  01/10/16 2101 01/11/16 0945 01/12/16 0357  WBC 7.6 6.5 4.4  NEUTROABS 5.4  --   --   HGB 5.0* 8.3* 8.2*  HCT 16.3* 26.8* 26.1*  MCV 73.4* 79.5 79.3  PLT 215 182 194      Assessment/Plan: S/P Duodenal ulcer bleed - stable to go home today on PPI PO BID for 3 months and then QD indefinitely. Hold baby Aspirin for 3 weeks if ok with primary team. Avoid alcohol. Will F/U on H. Pylori serology as an outpt. F/U with me in 6 weeks. Will sign off. Call if questions.   [place] C. 01/12/2016, 12:06 PM  Pager [phone]  If no answer or after 5 PM call [phone]

## 2016-01-12 NOTE — Discharge Summary (Signed)
Physician Discharge Summary  Nathan Padilla [id number] DOB: 08/22/1960 DOA: 01/10/2016  PCP: Kandice Hams, MD  Admit date: 01/10/2016 Discharge date: 01/12/2016  Time spent: 35 minutes  Recommendations for Outpatient Follow-up:  Melena/UGIB -7/25 EGD showed multiple duodenal ulcers see results below -Per Dr. Wilford Corner GI , Protonix 80 mg  BID for 3 months and then daily indefinitely -Hold on restarting aspirin for 3 weeks. -Follow-up in 6 weeks -Patient is safe to return to work on 7/27. Discussed in depth with patient and wife RN Tanya signs and symptoms which would require return to GI or EGD.  Severe acute blood loss anemia -Resolved  Hyponatremia -Resolved  Hypokalemia -Resolved  Hyperglycemia without history of diabetes -Resolved  HTN -Currently patient's BP well controlled without medication. Per patient was instructed prior to admission to discontinue his previous HTN regimen. Per patient and RN Lavella Lemons (wife) has follow-up appointment already scheduled.    Discharge Diagnoses:  Principal Problem:   Upper GI bleed Active Problems:   Symptomatic anemia   Hypokalemia   Discharge Condition: Stable   Diet recommendation: Regular  Filed Weights   01/10/16 2031 01/11/16 0210  Weight: 99.8 kg (220 lb) 93.2 kg (205 lb 7.5 oz)    History of present illness:  55 y.o.BM PMHx HTN   who presented to the ED with c/o SOB. Symptoms onset was ~4 days prior when he was cutting the grass. Symptoms gradually worsening since onset so he saw his PCP who found he had a HGB of 5 and sent him in to ED.  Patient reported black tarry stool which began approx 6 days prior to his presentation;  initially liquid but then become formed over the course of the week. During his hospitalization patient received EGD which showed multiple duodenal ulcers. Patient was started on Protonix BID and his H/H has been low but stable. Per patient negative fatigue, negative  lightheadedness. Has tolerated diet. Cleared by GI for discharge.   Procedures: 7/25 EGD; -cardia and gastric fundus were normal on retroflexion.-- Few oozing linear and superficial duodenal ulcers with no stigmata of bleeding were found in the duodenal bulb. Largest lesion was 1 mm in  largest dimension.  Consultations: Dr. Wilford Corner, [facility]     Discharge Exam: Vitals:   01/11/16 2211 01/12/16 0343 01/12/16 0724 01/12/16 1158  BP: 106/64 114/72 104/73 123/83  Pulse: 100 79 86 81  Resp: (!) 21 14    Temp: 98.1 F (36.7 C) 98 F (36.7 C) 98.2 F (36.8 C) 98 F (36.7 C)  TempSrc: Oral Oral Oral Oral  SpO2: 100% 99% 100%   Weight:      Height:        General: A/O 4, NAD Cardiovascular: Regular rhythm and rate, negative murmurs rubs or gallops, normal S1/S2 Respiratory: Clear to auscultation bilateral Abdomen: Nontender, positive bowel sounds  Discharge Instructions     Medication List    STOP taking these medications   aspirin EC 81 MG tablet   ibuprofen 200 MG tablet Commonly known as:  ADVIL,MOTRIN   losartan-hydrochlorothiazide 50-12.5 MG tablet Commonly known as:  HYZAAR     TAKE these medications   atorvastatin 10 MG tablet Commonly known as:  LIPITOR Take 10 mg by mouth daily.   pantoprazole 40 MG tablet Commonly known as:  PROTONIX Take 2 tablets (80 mg total) by mouth 2 (two) times daily.      Allergies  Allergen Reactions  . Shellfish Allergy Anaphylaxis   Follow-up Information  Lear Ng., MD .   Specialty:  Gastroenterology Why:  Scheduled appointment in 6 weeks from date of discharge for follow-up with Dr. Wilford Corner. Upper GI bleed Contact information: [address] [place] [address] [phone]            The results of significant diagnostics from this hospitalization (including imaging, microbiology, ancillary and laboratory) are listed below for reference.    Significant  Diagnostic Studies: Dg Chest Port 1 View  Result Date: 01/11/2016 CLINICAL DATA:  Shortness of breath, tachycardia, upper gastrointestinal hemorrhage EXAM: PORTABLE CHEST 1 VIEW COMPARISON:  None in PACs FINDINGS: The lungs are well-expanded and clear. The heart and pulmonary vascularity are normal. The mediastinum is normal in width. There is no pleural effusion. The bony thorax is unremarkable. IMPRESSION: There is no active cardiopulmonary disease. Electronically Signed   By: David  Martinique M.D.   On: 01/11/2016 07:45   Microbiology: Recent Results (from the past 240 hour(s))  MRSA PCR Screening     Status: None   Collection Time: 01/11/16  3:58 AM  Result Value Ref Range Status   MRSA by PCR NEGATIVE NEGATIVE Final    Comment:        The GeneXpert MRSA Assay (FDA approved for NASAL specimens only), is one component of a comprehensive MRSA colonization surveillance program. It is not intended to diagnose MRSA infection nor to guide or monitor treatment for MRSA infections.      Labs: Basic Metabolic Panel:  Recent Labs Lab 01/10/16 2100 01/10/16 2101 01/12/16 0357  NA 134* 133* 136  K 3.4* 3.3* 3.8  CL 95* 99* 105  CO2  --  25 27  GLUCOSE 172* 182* 105*  BUN 11 10 9   CREATININE 1.00 1.15 1.09  CALCIUM  --  8.5* 8.3*  MG  --   --  2.2   Liver Function Tests:  Recent Labs Lab 01/10/16 2101 01/12/16 0357  AST 22 20  ALT 19 19  ALKPHOS 26* 29*  BILITOT 0.4 0.5  PROT 5.9* 5.4*  ALBUMIN 3.4* 3.1*   No results for input(s): LIPASE, AMYLASE in the last 168 hours. No results for input(s): AMMONIA in the last 168 hours. CBC:  Recent Labs Lab 01/10/16 2100 01/10/16 2101 01/11/16 0945 01/12/16 0357  WBC  --  7.6 6.5 4.4  NEUTROABS  --  5.4  --   --   HGB 5.8* 5.0* 8.3* 8.2*  HCT 17.0* 16.3* 26.8* 26.1*  MCV  --  73.4* 79.5 79.3  PLT  --  215 182 194   Cardiac Enzymes: No results for input(s): CKTOTAL, CKMB, CKMBINDEX, TROPONINI in the last 168  hours. BNP: BNP (last 3 results) No results for input(s): BNP in the last 8760 hours.  ProBNP (last 3 results) No results for input(s): PROBNP in the last 8760 hours.  CBG: No results for input(s): GLUCAP in the last 168 hours.     Signed:  Dia Crawford, MD [facility] [phone] pager

## 2016-01-12 NOTE — Progress Notes (Signed)
Discussed and explained discharge instructions, f/u appt  given to pt and prescription sent electronicly to outpt. Pharmacy for pickup. Pt going home with wife and belongings.

## 2016-01-13 LAB — H. PYLORI ANTIBODY, IGG: H Pylori IgG: 7.4 U/mL — ABNORMAL HIGH (ref 0.0–0.8)

## 2016-01-13 LAB — HEMOGLOBIN A1C
Hgb A1c MFr Bld: 5.7 % — ABNORMAL HIGH (ref 4.8–5.6)
Mean Plasma Glucose: 117 mg/dL

## 2016-01-14 DIAGNOSIS — K264 Chronic or unspecified duodenal ulcer with hemorrhage: Secondary | ICD-10-CM | POA: Diagnosis not present

## 2016-01-14 LAB — TYPE AND SCREEN
ABO/RH(D): B POS
ANTIBODY SCREEN: NEGATIVE
UNIT DIVISION: 0
UNIT DIVISION: 0
UNIT DIVISION: 0
Unit division: 0
Unit division: 0

## 2016-01-21 ENCOUNTER — Encounter: Payer: Self-pay | Admitting: Internal Medicine

## 2016-01-25 MED FILL — CLARITHROMYCIN 500 MG TAB: 500 | 14 days supply | Qty: 28 | Fill #0

## 2016-01-25 MED FILL — AMOXICILLIN 500 MG CAPSULE: 500 | 14 days supply | Qty: 56 | Fill #0

## 2016-01-25 MED FILL — OMEPRAZOLE DR 20 MG CAPSULE: 20 | 14 days supply | Qty: 28 | Fill #0

## 2016-02-11 DIAGNOSIS — K279 Peptic ulcer, site unspecified, unspecified as acute or chronic, without hemorrhage or perforation: Secondary | ICD-10-CM | POA: Diagnosis not present

## 2016-02-11 DIAGNOSIS — B9681 Helicobacter pylori [H. pylori] as the cause of diseases classified elsewhere: Secondary | ICD-10-CM | POA: Diagnosis not present

## 2016-02-11 DIAGNOSIS — D649 Anemia, unspecified: Secondary | ICD-10-CM | POA: Diagnosis not present

## 2016-02-22 DIAGNOSIS — A048 Other specified bacterial intestinal infections: Secondary | ICD-10-CM | POA: Diagnosis not present

## 2016-02-22 DIAGNOSIS — K279 Peptic ulcer, site unspecified, unspecified as acute or chronic, without hemorrhage or perforation: Secondary | ICD-10-CM | POA: Diagnosis not present

## 2016-04-24 DIAGNOSIS — D649 Anemia, unspecified: Secondary | ICD-10-CM | POA: Diagnosis not present

## 2016-06-16 MED FILL — PANTOPRAZOLE SOD DR 40 MG T: 40 | 45 days supply | Qty: 180 | Fill #0

## 2016-07-31 IMAGING — CR DG CHEST 1V PORT
1 series · 1 of 1 positions shown · non-contrast
Comparison: None in PACs

CLINICAL DATA: Shortness of breath, tachycardia, upper
gastrointestinal hemorrhage

EXAM:
PORTABLE CHEST 1 VIEW

[AP]
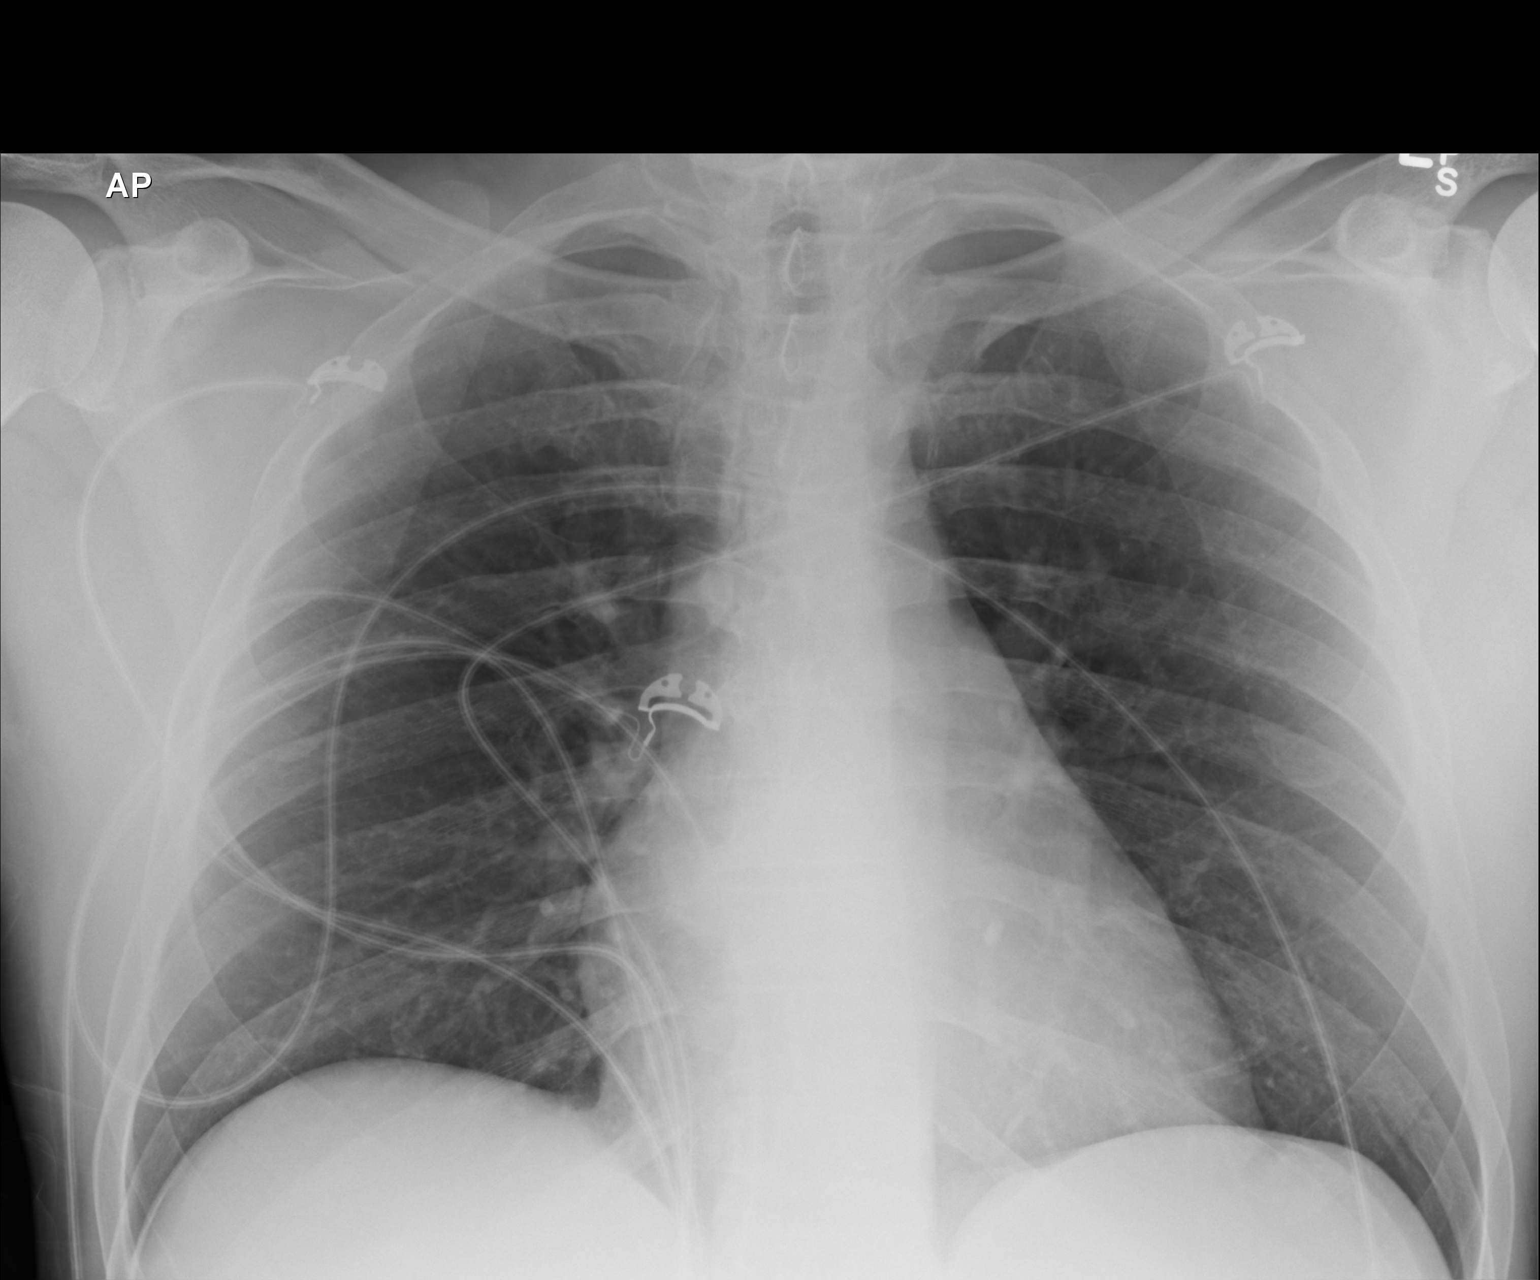

[1 of 1 positions shown; findings below may reference images not displayed]

FINDINGS: The lungs are well-expanded and clear. The heart and pulmonary
vascularity are normal. The mediastinum is normal in width. There is
no pleural effusion. The bony thorax is unremarkable.
IMPRESSION: There is no active cardiopulmonary disease.

## 2016-11-10 MED FILL — PANTOPRAZOLE SOD DR 40 MG T: 40 | 30 days supply | Qty: 30 | Fill #0

## 2016-12-22 MED FILL — PANTOPRAZOLE SOD DR 40 MG T: 40 | 30 days supply | Qty: 30 | Fill #1

## 2017-03-13 MED FILL — PANTOPRAZOLE SOD DR 40 MG T: 40 | 30 days supply | Qty: 30 | Fill #2

## 2017-04-04 MED FILL — ATORVASTATIN 10 MG TABLET: 10 | 90 days supply | Qty: 90 | Fill #0

## 2017-04-04 MED FILL — LOSARTAN-HCTZ 50-12.5 MG TA: 50-12.5 | 90 days supply | Qty: 90 | Fill #0

## 2017-04-06 MED FILL — PANTOPRAZOLE SOD DR 40 MG T: 40 | 30 days supply | Qty: 30 | Fill #3

## 2017-07-16 MED FILL — LOSARTAN-HCTZ 50-12.5 MG TA: 50-12.5 | 90 days supply | Qty: 90 | Fill #1

## 2017-07-16 MED FILL — PANTOPRAZOLE SOD DR 40 MG T: 40 | 30 days supply | Qty: 30 | Fill #0

## 2017-07-16 MED FILL — ATORVASTATIN 10 MG TABLET: 10 | 90 days supply | Qty: 90 | Fill #0

## 2017-09-13 DIAGNOSIS — Z125 Encounter for screening for malignant neoplasm of prostate: Secondary | ICD-10-CM | POA: Diagnosis not present

## 2017-09-13 DIAGNOSIS — E7800 Pure hypercholesterolemia, unspecified: Secondary | ICD-10-CM | POA: Diagnosis not present

## 2017-09-13 DIAGNOSIS — I10 Essential (primary) hypertension: Secondary | ICD-10-CM | POA: Diagnosis not present

## 2017-09-13 DIAGNOSIS — Z0000 Encounter for general adult medical examination without abnormal findings: Secondary | ICD-10-CM | POA: Diagnosis not present

## 2017-09-13 DIAGNOSIS — D649 Anemia, unspecified: Secondary | ICD-10-CM | POA: Diagnosis not present

## 2017-09-24 DIAGNOSIS — Z86010 Personal history of colonic polyps: Secondary | ICD-10-CM | POA: Diagnosis not present

## 2017-09-24 DIAGNOSIS — K219 Gastro-esophageal reflux disease without esophagitis: Secondary | ICD-10-CM | POA: Diagnosis not present

## 2017-09-24 DIAGNOSIS — A048 Other specified bacterial intestinal infections: Secondary | ICD-10-CM | POA: Diagnosis not present

## 2017-09-24 DIAGNOSIS — Z8711 Personal history of peptic ulcer disease: Secondary | ICD-10-CM | POA: Diagnosis not present

## 2017-09-24 MED FILL — PANTOPRAZOLE SOD DR 40 MG T: 40 | 30 days supply | Qty: 30 | Fill #0

## 2017-12-31 MED FILL — PANTOPRAZOLE SOD DR 40 MG T: 40 | 30 days supply | Qty: 30 | Fill #1

## 2017-12-31 MED FILL — LOSARTAN-HCTZ 50-12.5 MG TA: 50-12.5 | 90 days supply | Qty: 90 | Fill #0

## 2018-01-30 MED FILL — PANTOPRAZOLE SOD DR 40 MG T: 40 | 90 days supply | Qty: 90 | Fill #2

## 2018-04-04 MED FILL — PANTOPRAZOLE SOD DR 40 MG T: 40 | 90 days supply | Qty: 180 | Fill #0

## 2018-04-09 MED FILL — LOSARTAN-HCTZ 50-12.5 MG TA: 50-12.5 | 90 days supply | Qty: 90 | Fill #1

## 2018-09-04 MED FILL — LOSARTAN-HCTZ 50-12.5 MG TA: 50-12.5 | 90 days supply | Qty: 90 | Fill #2

## 2018-09-23 ENCOUNTER — Ambulatory Visit (HOSPITAL_COMMUNITY)
Admission: EM | Admit: 2018-09-23 | Discharge: 2018-09-23 | Disposition: A | Discharge status: Home / Self Care | Payer: 59 | Attending: Emergency Medicine | Admitting: Emergency Medicine

## 2018-09-23 ENCOUNTER — Other Ambulatory Visit: Payer: Self-pay

## 2018-09-23 ENCOUNTER — Encounter (HOSPITAL_COMMUNITY): Payer: Self-pay

## 2018-09-23 ENCOUNTER — Ambulatory Visit (INDEPENDENT_AMBULATORY_CARE_PROVIDER_SITE_OTHER): Payer: 59

## 2018-09-23 DIAGNOSIS — S6991XA Unspecified injury of right wrist, hand and finger(s), initial encounter: Secondary | ICD-10-CM | POA: Diagnosis not present

## 2018-09-23 DIAGNOSIS — W01118A Fall on same level from slipping, tripping and stumbling with subsequent striking against other sharp object, initial encounter: Secondary | ICD-10-CM | POA: Diagnosis not present

## 2018-09-23 DIAGNOSIS — S62324A Displaced fracture of shaft of fourth metacarpal bone, right hand, initial encounter for closed fracture: Secondary | ICD-10-CM

## 2018-09-23 DIAGNOSIS — S62304A Unspecified fracture of fourth metacarpal bone, right hand, initial encounter for closed fracture: Secondary | ICD-10-CM | POA: Diagnosis not present

## 2018-09-23 DIAGNOSIS — W01119A Fall on same level from slipping, tripping and stumbling with subsequent striking against unspecified sharp object, initial encounter: Secondary | ICD-10-CM | POA: Diagnosis not present

## 2018-09-23 NOTE — ED Provider Notes (Signed)
HPI  SUBJECTIVE:  Nathan Padilla is a left handed 58 y.o. male who presents with right hand pain, swelling after tripping and falling in the woods 3 days ago.  Describes the pain as dull, achy, constant.   States that he fell and his middle and ring fingers were split across a piece of wood.  Denies feeling a pop.  Reports swelling, bruising. States that he feels grinding when he moves his fingers.  He states that he can still use his hand and fingers without any problem.  Denies numbness or tingling.  He reports initial wrist pain described as a "sprain" but this has since resolved.  He states that he had an exact injury where he fractured the fourth metacarpal with the same mechanism years ago.  He has tried an Ace wrap, buddy taping his ring and middle finger, ibuprofen with improvement of symptoms.  Symptoms worse with palpation.  Past medical history of hypertension.  PMD: Dr. Delfina Redwood.   Past Medical History:  Diagnosis Date  . Hypertension     Past Surgical History:  Procedure Laterality Date  . ESOPHAGOGASTRODUODENOSCOPY N/A 01/11/2016   Procedure: ESOPHAGOGASTRODUODENOSCOPY (EGD);  Surgeon: Wilford Corner, MD;  Location: [facility] ENDOSCOPY;  Service: Endoscopy;  Laterality: N/A;  . KNEE SURGERY      History reviewed. No pertinent family history.  Social History   Tobacco Use  . Smoking status: Light Tobacco Smoker    Types: Cigars  . Smokeless tobacco: Never Used  Substance Use Topics  . Alcohol use: Yes  . Drug use: Yes    Types: Marijuana    No current facility-administered medications for this encounter.   Current Outpatient Medications:  .  atorvastatin (LIPITOR) 10 MG tablet, Take 10 mg by mouth daily., Disp: , Rfl: 6 .  pantoprazole (PROTONIX) 40 MG tablet, Take 2 tablets (80 mg total) by mouth 2 (two) times daily., Disp: 180 tablet, Rfl: 1  Allergies  Allergen Reactions  . Shellfish Allergy Anaphylaxis     ROS  As noted in HPI.   Physical Exam  BP (!)  166/93 (BP Location: Right Arm)   Pulse 97   Temp 98.7 F (37.1 C) (Oral)   Resp 18   Wt 104.3 kg   SpO2 97%   BMI 28.00 kg/m   Constitutional: Well developed, well nourished, no acute distress Eyes:  EOMI, conjunctiva normal bilaterally HENT: Normocephalic, atraumatic,mucus membranes moist Respiratory: Normal inspiratory effort Cardiovascular: Normal rate GI: nondistended skin: No rash, skin intact Musculoskeletal: Positive swelling dorsum of the right hand.  Positive bruising along the palm and dorsum of the right hand.  Positive crepitus along the fourth metacarpal.  No other tenderness along all of the other carpals, metacarpals, phalanges.  No tenderness over the wrist.  No limitation of motion of the fingers of the wrist.  two Point discrimination intact in the middle and ring fingers, no deficits in the median/radial/ulnar distribution. Neurologic: Alert & oriented x 3, no focal neuro deficits Psychiatric: Speech and behavior appropriate   ED Course   Medications - No data to display  Orders Placed This Encounter  Procedures  . DG Hand Complete Right    Standing Status:   Standing    Number of Occurrences:   1    Order Specific Question:   Reason for Exam (SYMPTOM  OR DIAGNOSIS REQUIRED)    Answer:   fall. bruise swell palm,  tenderness crepitus 4th MC r/o fx  . Apply splint Ulnar Gutter  Standing Status:   Standing    Number of Occurrences:   1    Order Specific Question:   Laterality    Answer:   Right    No results found for this or any previous visit (from the past 24 hour(s)). Dg Hand Complete Right  Result Date: 09/23/2018 CLINICAL DATA:  Per pt: three days ago, out in the woods, tripped and there was a branch that lodged between the middle and ring finger, fingers were straight on impact. Pain swelling is the right hand, fourth metacarpal. Prior fracture on the right fourth metacarpal over 15 years ago. Patient is not a diabetic EXAM: RIGHT HAND - COMPLETE  3+ VIEW COMPARISON:  None. FINDINGS: Acute, spiral fracture of the proximal to mid right fourth metacarpal, distal fracture component slightly displaced in a palmar and ulnar direction by 2 mm. No fracture angulation. No other fractures. 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 Remaining joints normally spaced and aligned. There is dorsal soft tissue swelling. IMPRESSION: Acute, minimally displaced, nonangulated fracture of the right fourth metacarpal. No other fractures. No dislocation. Electronically Signed   By: Lajean Manes M.D.   On: 09/23/2018 15:08    ED Clinical Impression  Closed displaced fracture of shaft of fourth metacarpal bone of right hand, initial encounter   ED Assessment/Plan   Reviewed imaging independently. Acute minimally displaced nonangulated fracture of the right fourth metacarpal.  See radiology report for full details.  We will have this placed in a ulnar gutter splint, follow-up with hand within a week.  Dr. Lenon Curt on call.  Discussed  imaging, MDM, treatment plan, and plan for follow-up with patient. Discussed sn/sx that should prompt return to the ED. patient agrees with plan.   No orders of the defined types were placed in this encounter.   *This clinic note was created using Dragon dictation software. Therefore, there may be occasional mistakes despite careful proofreading.   ?    Melynda Ripple, MD 09/23/18 1701

## 2018-09-23 NOTE — ED Notes (Signed)
Patient verbalizes understanding of discharge instructions. Opportunity for questioning and answers were provided. Patient discharged from [facility] by RN.  

## 2018-09-23 NOTE — Progress Notes (Signed)
Orthopedic Tech Progress Note Patient Details:  Nathan Padilla 06-Dec-1960 [id number]  Ortho Devices Type of Ortho Device: Ulna gutter splint Ortho Device/Splint Location: Right Hand  Ortho Device/Splint Interventions: Ordered, Adjustment   Post Interventions Instructions Provided: Adjustment of device, Care of device   [name] [name] 09/23/2018, 4:04 PM

## 2018-09-23 NOTE — ED Triage Notes (Addendum)
Pt cc was running and tripped and fell over a piece of wood  and injured his right hand. This happen on last Saturday. 3 days ago.

## 2018-09-23 NOTE — Discharge Instructions (Addendum)
1 g of Tylenol 3 or 4 times a day as needed for pain.  Wear the ulnar gutter splint at all times until you are seen by hand specialist.  I would like for you to be seen by somebody as soon as you possibly can.

## 2018-10-04 MED FILL — PANTOPRAZOLE SOD DR 40 MG T: 40 | 90 days supply | Qty: 180 | Fill #0

## 2018-10-25 DIAGNOSIS — M79641 Pain in right hand: Secondary | ICD-10-CM | POA: Diagnosis not present

## 2018-10-25 DIAGNOSIS — S62354A Nondisplaced fracture of shaft of fourth metacarpal bone, right hand, initial encounter for closed fracture: Secondary | ICD-10-CM | POA: Diagnosis not present

## 2018-10-31 DIAGNOSIS — M79641 Pain in right hand: Secondary | ICD-10-CM | POA: Diagnosis not present

## 2018-11-08 DIAGNOSIS — M79641 Pain in right hand: Secondary | ICD-10-CM | POA: Diagnosis not present

## 2018-11-20 DIAGNOSIS — M79641 Pain in right hand: Secondary | ICD-10-CM | POA: Diagnosis not present

## 2018-11-22 DIAGNOSIS — G5601 Carpal tunnel syndrome, right upper limb: Secondary | ICD-10-CM | POA: Diagnosis not present

## 2018-11-22 DIAGNOSIS — S62354A Nondisplaced fracture of shaft of fourth metacarpal bone, right hand, initial encounter for closed fracture: Secondary | ICD-10-CM | POA: Diagnosis not present

## 2018-11-25 DIAGNOSIS — M79641 Pain in right hand: Secondary | ICD-10-CM | POA: Diagnosis not present

## 2018-11-29 DIAGNOSIS — M79641 Pain in right hand: Secondary | ICD-10-CM | POA: Diagnosis not present

## 2018-12-02 DIAGNOSIS — M79641 Pain in right hand: Secondary | ICD-10-CM | POA: Diagnosis not present

## 2018-12-06 DIAGNOSIS — M79641 Pain in right hand: Secondary | ICD-10-CM | POA: Diagnosis not present

## 2018-12-19 DIAGNOSIS — M79641 Pain in right hand: Secondary | ICD-10-CM | POA: Diagnosis not present

## 2018-12-20 MED FILL — LOSARTAN-HCTZ 50-12.5 MG TA: 50-12.5 | 90 days supply | Qty: 90 | Fill #3

## 2019-01-21 MED FILL — PANTOPRAZOLE SOD DR 40 MG T: 40 | 90 days supply | Qty: 180 | Fill #0

## 2019-03-21 MED FILL — LOSARTAN-HCTZ 50-12.5 MG TA: 50-12.5 | 30 days supply | Qty: 30 | Fill #0

## 2019-04-13 IMAGING — DX RIGHT HAND - COMPLETE 3+ VIEW
3 series · 3 of 3 positions shown · non-contrast
Comparison: None.

CLINICAL DATA: Per pt: three days ago, out in the woods, tripped
and there was a branch that lodged between the middle and ring
finger, fingers were straight on impact. Pain swelling is the right
hand, fourth metacarpal. Prior fracture on the right fourth
metacarpal over 15 years ago. Patient is not a diabetic

EXAM:
RIGHT HAND - COMPLETE 3+ VIEW

[hand pa]
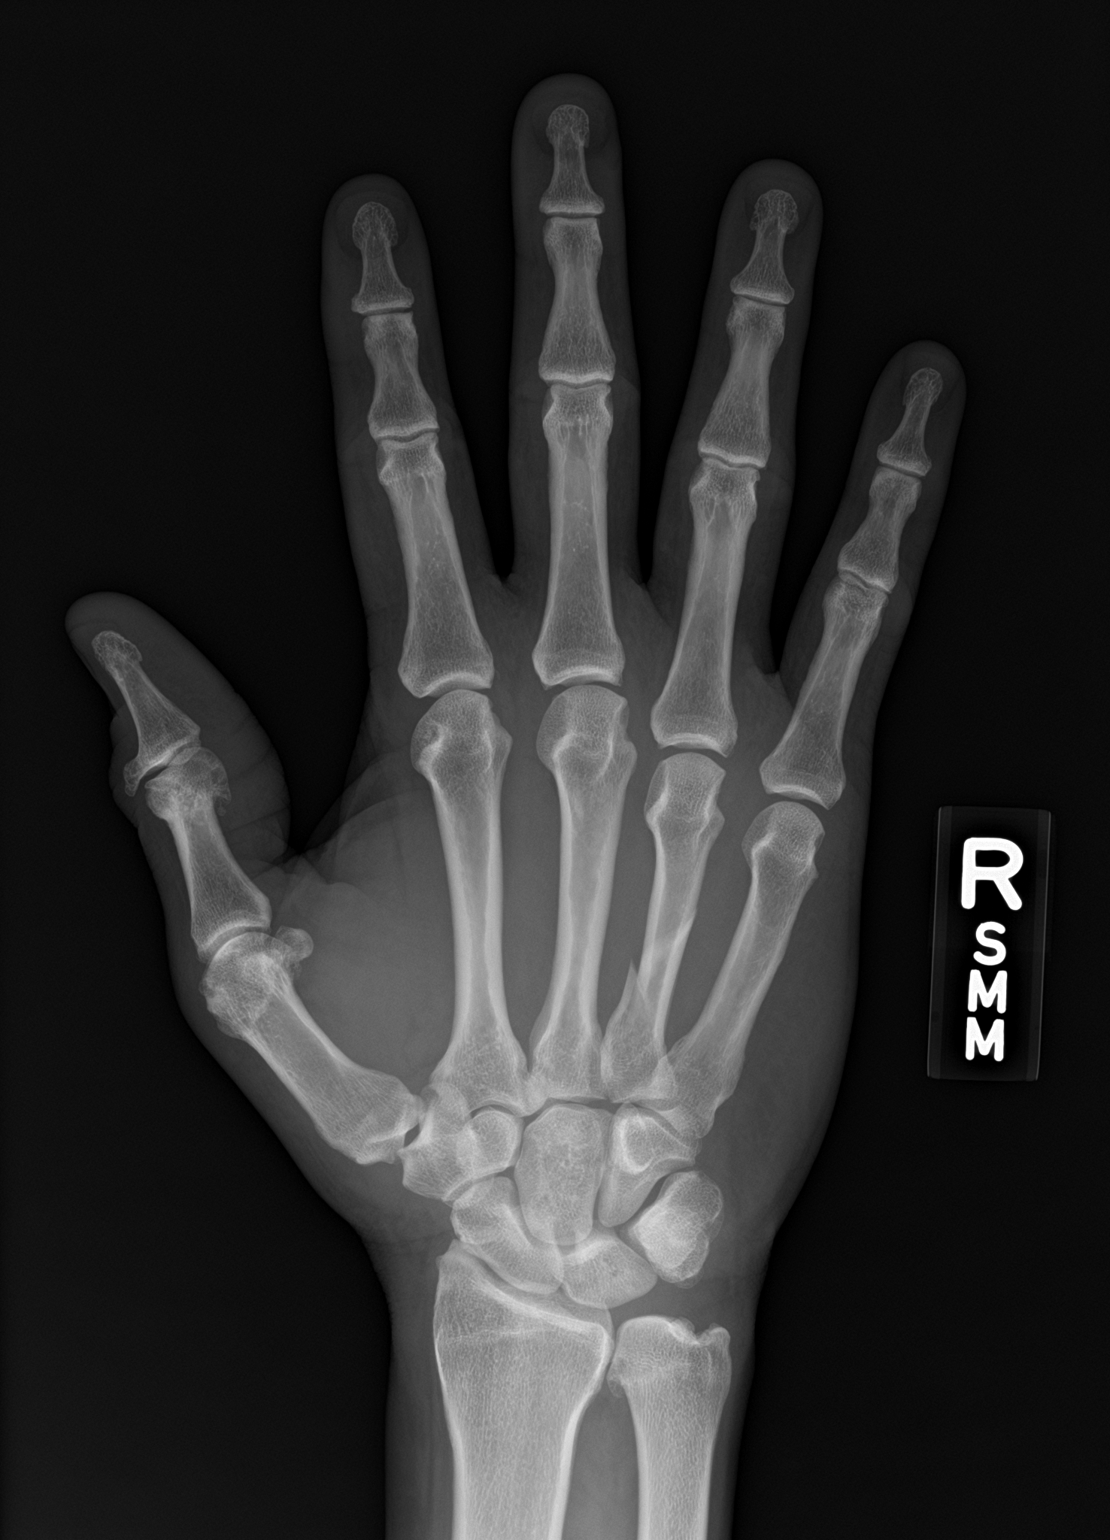

[hand obl]
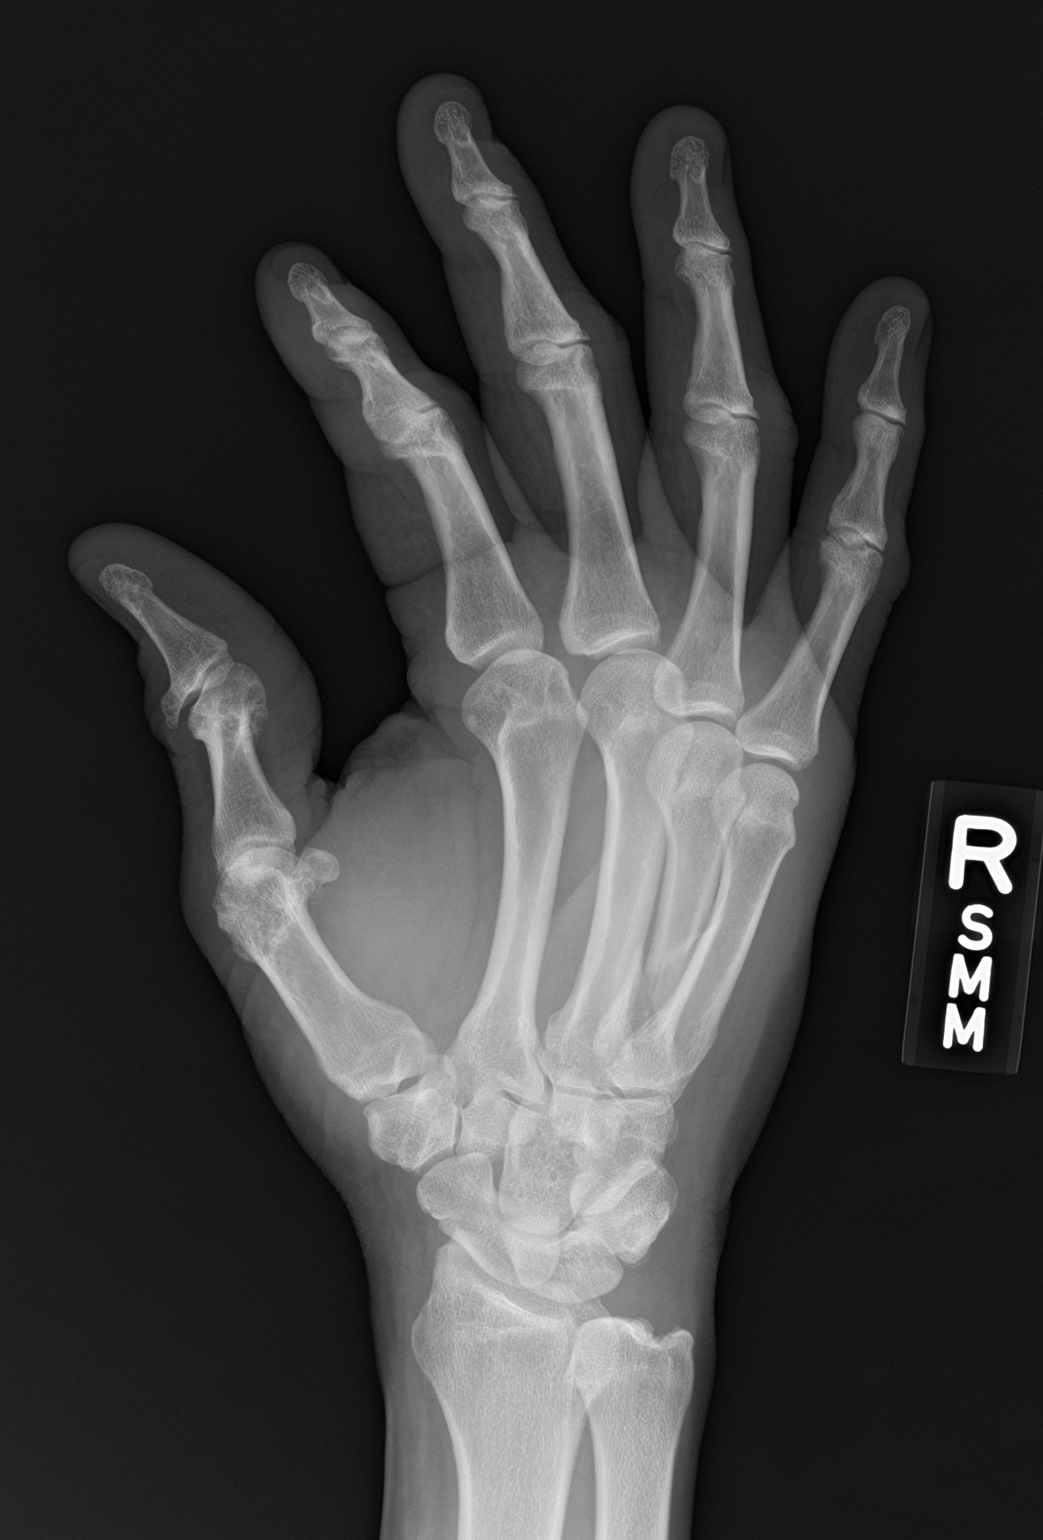

[hand lat]
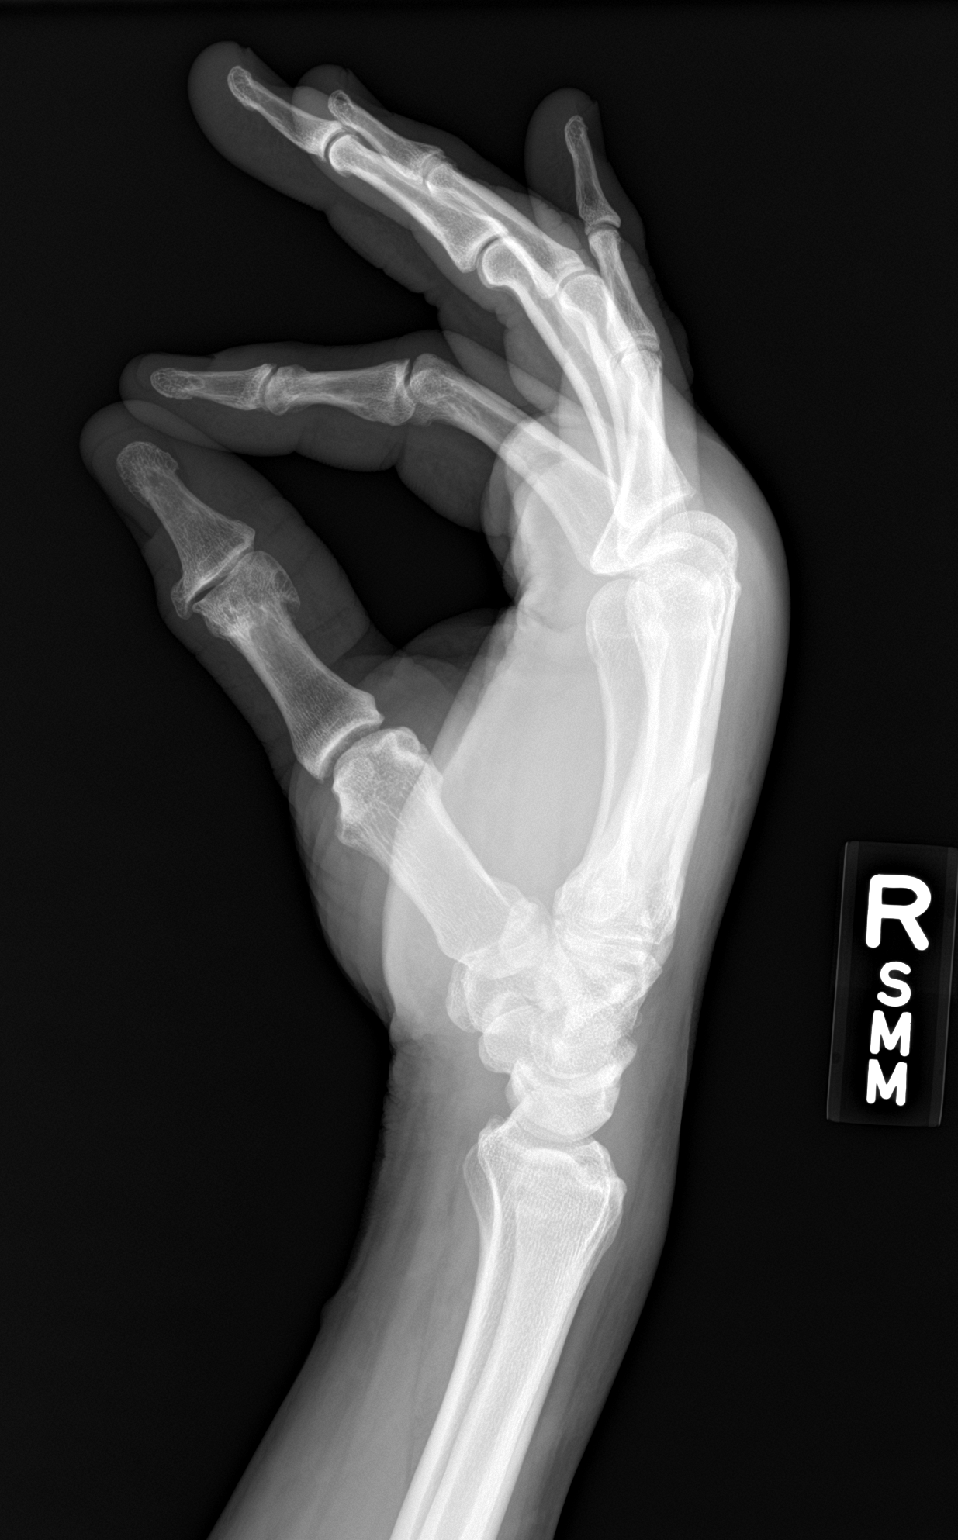

[3 of 3 positions shown; findings below may reference images not displayed]

FINDINGS: Acute, spiral fracture of the proximal to mid right fourth
metacarpal, distal fracture component slightly displaced in a palmar
and ulnar direction by 2 mm. No fracture angulation.

No other fractures.

Osteoarthritic changes at the IP joint of the thumb. Remaining
joints normally spaced and aligned.

There is dorsal soft tissue swelling.
IMPRESSION: Acute, minimally displaced, nonangulated fracture of the right
fourth metacarpal. No other fractures. No dislocation.

## 2019-04-16 DIAGNOSIS — E7800 Pure hypercholesterolemia, unspecified: Secondary | ICD-10-CM | POA: Diagnosis not present

## 2019-04-16 DIAGNOSIS — I10 Essential (primary) hypertension: Secondary | ICD-10-CM | POA: Diagnosis not present

## 2019-04-16 DIAGNOSIS — Z86010 Personal history of colonic polyps: Secondary | ICD-10-CM | POA: Diagnosis not present

## 2019-04-21 MED FILL — LOSARTAN-HCTZ 50-12.5 MG TA: 50-12.5 | 90 days supply | Qty: 90 | Fill #0

## 2019-04-21 MED FILL — PANTOPRAZOLE SOD DR 40 MG T: 40 | 90 days supply | Qty: 180 | Fill #0

## 2019-04-22 MED FILL — ROSUVASTATIN CALCIUM 10 MG: 10 | 30 days supply | Qty: 30 | Fill #0

## 2019-05-21 DIAGNOSIS — Z8711 Personal history of peptic ulcer disease: Secondary | ICD-10-CM | POA: Diagnosis not present

## 2019-05-21 DIAGNOSIS — K219 Gastro-esophageal reflux disease without esophagitis: Secondary | ICD-10-CM | POA: Diagnosis not present

## 2019-05-21 DIAGNOSIS — Z86010 Personal history of colonic polyps: Secondary | ICD-10-CM | POA: Diagnosis not present

## 2019-05-26 MED FILL — PEG-3350 SOLUTION: 420 | 1 days supply | Qty: 4000 | Fill #0

## 2019-06-17 DIAGNOSIS — H524 Presbyopia: Secondary | ICD-10-CM | POA: Diagnosis not present

## 2019-07-14 MED FILL — ROSUVASTATIN CALCIUM 10 MG: 10 | 30 days supply | Qty: 30 | Fill #1

## 2019-07-14 MED FILL — LOSARTAN-HCTZ 50-12.5 MG TA: 50-12.5 | 90 days supply | Qty: 90 | Fill #1

## 2019-07-21 ENCOUNTER — Ambulatory Visit: Payer: Self-pay

## 2019-07-21 ENCOUNTER — Ambulatory Visit (INDEPENDENT_AMBULATORY_CARE_PROVIDER_SITE_OTHER): Payer: 59 | Admitting: Family Medicine

## 2019-07-21 ENCOUNTER — Other Ambulatory Visit: Payer: Self-pay

## 2019-07-21 ENCOUNTER — Encounter: Payer: Self-pay | Admitting: Family Medicine

## 2019-07-21 VITALS — BP 122/74 | Ht 76.0 in | Wt 230.0 lb

## 2019-07-21 DIAGNOSIS — M25561 Pain in right knee: Secondary | ICD-10-CM | POA: Diagnosis not present

## 2019-07-21 DIAGNOSIS — S76311A Strain of muscle, fascia and tendon of the posterior muscle group at thigh level, right thigh, initial encounter: Secondary | ICD-10-CM | POA: Diagnosis not present

## 2019-07-21 NOTE — Assessment & Plan Note (Signed)
Hx and physical c/w distal medial hamstring tendon strain. [place] does not show tear. ACL special tests all negative and reassuring. Patient counseled on RICE therapy and given compression sleeve. Given home exercises for quad and hamstring strengthening to use as directed every day. Tylenol, aleve only if needed. Follow up in 2 weeks for reevaluation to determine if further imaging with MRI is indicated.

## 2019-07-21 NOTE — Patient Instructions (Signed)
Your exam and ultrasound are reassuring. You strained your medial hamstring tendons. Icing 15 minutes at a time 3-4 times a day. Compression sleeve when up and walking around. Start home exercises as directed every day. Tylenol, aleve only if needed. Follow up with me in 2 weeks for reevaluation.

## 2019-07-21 NOTE — Progress Notes (Signed)
   HPI  CC: R knee pain  Patient reports that on Saturday he was walking to his shed to place things in storage and when he stepped up with his left leg, his right leg made a "pop" and he felt immediate pain right knee mostly posteriorly. He was not able to walk on Saturday after that, but then was able to walk some on Sunday with a limp, and has been able to walk today about a mile. He reports that he noticed a bruise form yesterday on the back of his leg. He feels as if his knee is a little swollen today, but that is the most it has been swollen. He has pain, but it is not terrible. Describes the pain as a dull pain, worst over the medial aspect of his posterior knee a little proximal to the knee, around his medical hamstring. He states that he is concerned that hearing the pop he may have torn his ACL. He has  A history of a left quadriceps tendon tear that required surgery.   Medications/Interventions Tried: rest and elevation  See HPI and/or previous note for associated ROS.  Objective: BP 122/74   Ht 6\' 4"  (1.93 m)   Wt 230 lb (104.3 kg)   BMI 28.00 kg/m  Gen: NAD, well groomed, a/o x3, normal affect.  CV: Well-perfused. Warm.  Resp: Non-labored.  Knee:  Normal to inspection with no erythema or obvious bony abnormalities. Mild effusion.  No obvious Baker's cyst. Palpation normal with no warmth or joint line tenderness or patellar tenderness or condyle tenderness.  No TTP along infrapatellar or pes anserine bursas.   ROM normal in flexion (135 degrees) and extension (0 degrees) and lower leg rotation. Ligaments with solid consistent endpoints including ACL, PCL, LCL, MCL.  Negative Anterior Drawer/Lachman/Lever test. Negative Mcmurray's and provocative meniscal tests including thessaly, apley, bounce. Non painful patellar compression.  Normal Patellar glide.  No apprehension  Patellar and quadriceps tendons unremarkable. Hamstring and quadriceps strength is normal. Ecchymosis on  posterior aspect of knee, TTP over distal medial hamstring.  Neurovascularly intact B/L LE  [place] of R knee with no signs of hamstring tendon rupture or tear. Mild effusion and notable enthesopathy of distal quad tendon medially and laterally, patellar tendon superiorly.  Medial meniscus tear noted.     Assessment and plan:  Hamstring strain, right, initial encounter Hx and physical c/w distal medial hamstring tendon strain. [place] does not show tear. ACL special tests all negative and reassuring. Patient counseled on RICE therapy and given compression sleeve. Given home exercises for quad and hamstring strengthening to use as directed every day. Tylenol, aleve only if needed. Follow up in 2 weeks for reevaluation.  Nathan Arayah Krouse, DO PGY-3, [facility]  07/21/2019 3:01 PM

## 2019-08-04 ENCOUNTER — Ambulatory Visit: Payer: 59 | Admitting: Family Medicine

## 2019-08-13 ENCOUNTER — Ambulatory Visit: Payer: 59 | Admitting: Family Medicine

## 2019-08-18 ENCOUNTER — Ambulatory Visit: Payer: 59 | Admitting: Family Medicine

## 2019-10-02 MED FILL — PANTOPRAZOLE SOD DR 40 MG T: 40 | 90 days supply | Qty: 180 | Fill #0

## 2019-10-02 MED FILL — LOSARTAN-HCTZ 50-12.5 MG TA: 50-12.5 | 90 days supply | Qty: 90 | Fill #2

## 2020-01-12 MED FILL — LOSARTAN-HCTZ 50-12.5 MG TA: 50-12.5 | 90 days supply | Qty: 90 | Fill #3

## 2020-03-15 ENCOUNTER — Other Ambulatory Visit (HOSPITAL_COMMUNITY): Payer: Self-pay | Admitting: Internal Medicine

## 2020-03-15 MED FILL — PANTOPRAZOLE SOD DR 40 MG T: 40 | 90 days supply | Qty: 180 | Fill #1

## 2020-03-29 MED FILL — LOSARTAN-HCTZ 50-12.5 MG TA: 50-12.5 | 90 days supply | Qty: 90 | Fill #0

## 2020-09-20 ENCOUNTER — Other Ambulatory Visit (HOSPITAL_COMMUNITY): Payer: Self-pay

## 2020-09-21 ENCOUNTER — Other Ambulatory Visit (HOSPITAL_COMMUNITY): Payer: Self-pay

## 2020-09-21 MED ORDER — PANTOPRAZOLE SODIUM 40 MG PO TBEC
DELAYED_RELEASE_TABLET | ORAL | 0 refills | Status: AC
  Filled 2020-09-21: qty 180, 90d supply, fill #0

## 2020-09-29 ENCOUNTER — Other Ambulatory Visit (HOSPITAL_COMMUNITY): Payer: Self-pay

## 2020-11-17 ENCOUNTER — Other Ambulatory Visit (HOSPITAL_COMMUNITY): Payer: Self-pay

## 2020-11-17 MED ORDER — CARESTART COVID-19 HOME TEST VI KIT
PACK | 0 refills | Status: AC
  Filled 2020-11-17: qty 4, 4d supply, fill #0

## 2021-05-19 ENCOUNTER — Other Ambulatory Visit (HOSPITAL_COMMUNITY): Payer: Self-pay

## 2021-05-31 ENCOUNTER — Ambulatory Visit: Payer: 59 | Attending: Internal Medicine

## 2021-05-31 ENCOUNTER — Other Ambulatory Visit: Payer: Self-pay

## 2021-05-31 DIAGNOSIS — Z23 Encounter for immunization: Secondary | ICD-10-CM

## 2021-05-31 MED ORDER — PFIZER COVID-19 VAC BIVALENT 30 MCG/0.3ML IM SUSP
INTRAMUSCULAR | 0 refills | Status: AC
  Filled 2021-05-31: qty 0.3, 1d supply, fill #0

## 2021-05-31 NOTE — Progress Notes (Signed)
° °  Covid-19 Vaccination Clinic  Name:  MALAKYE NOLDEN    MRN: [id number] DOB: [birth year]  05/31/2021  Mr. Pletz was observed post Covid-19 immunization for 15 minutes without incident. He was provided with Vaccine Information Sheet and instruction to access the V-Safe system.   Mr. Caperton was instructed to call 911 with any severe reactions post vaccine: Difficulty breathing  Swelling of face and throat  A fast heartbeat  A bad rash all over body  Dizziness and weakness   Immunizations Administered     Name Date Dose VIS Date Route   Pfizer Covid-19 Vaccine Bivalent Booster 05/31/2021 12:24 PM 0.3 mL 02/16/2021 Intramuscular   Manufacturer: [place]   Lot: [id number]   [place]: [place], PharmD, MBA Clinical Acute Care Pharmacist

## 2022-07-06 ENCOUNTER — Other Ambulatory Visit (HOSPITAL_COMMUNITY): Payer: Self-pay

## 2023-04-18 ENCOUNTER — Other Ambulatory Visit (HOSPITAL_BASED_OUTPATIENT_CLINIC_OR_DEPARTMENT_OTHER): Payer: Self-pay

## 2023-04-18 MED ORDER — INFLUENZA VIRUS VACC SPLIT PF (FLUZONE) 0.5 ML IM SUSY
0.5000 mL | PREFILLED_SYRINGE | Freq: Once | INTRAMUSCULAR | 0 refills | Status: AC
  Filled 2023-04-18: qty 0.5, 1d supply, fill #0

## 2023-07-10 ENCOUNTER — Other Ambulatory Visit (HOSPITAL_COMMUNITY): Payer: Self-pay
